# Patient Record
Sex: Male | Born: 1935 | Race: White | Hispanic: No | Marital: Married | Smoking: Never smoker
Health system: Southern US, Community
[De-identification: ages and names within clinical notes are randomized; demographics above are authoritative.]

## PROBLEM LIST (undated history)

## (undated) DIAGNOSIS — J45909 Unspecified asthma, uncomplicated: Secondary | ICD-10-CM

## (undated) DIAGNOSIS — I10 Essential (primary) hypertension: Secondary | ICD-10-CM

## (undated) DIAGNOSIS — E7800 Pure hypercholesterolemia, unspecified: Secondary | ICD-10-CM

## (undated) HISTORY — DX: Pure hypercholesterolemia, unspecified: E78.00

## (undated) HISTORY — PX: REPLACEMENT TOTAL KNEE: SUR1224

## (undated) HISTORY — PX: APPENDECTOMY: SHX54

## (undated) HISTORY — DX: Essential (primary) hypertension: I10

## (undated) HISTORY — DX: Unspecified asthma, uncomplicated: J45.909

## (undated) HISTORY — PX: ANKLE FRACTURE SURGERY: SHX122

---

## 2008-01-26 ENCOUNTER — Inpatient Hospital Stay (HOSPITAL_COMMUNITY): Admission: AD | Admit: 2008-01-26 | Discharge: 2008-01-27 | Payer: Self-pay | Admitting: Neurosurgery

## 2010-12-19 NOTE — Op Note (Signed)
[name], Robert Cain               ACCOUNT NO.:  [id number]   MEDICAL RECORD NO.:  [id number]          PATIENT TYPE:  INP   LOCATION:  3033                         FACILITY:  [facility]   PHYSICIAN:  Kathaleen Maser. Pool, M.D.    DATE OF BIRTH:  1929/10/21   DATE OF PROCEDURE:  01/26/2008  DATE OF DISCHARGE:                               OPERATIVE REPORT   PREOPERATIVE DIAGNOSIS:  L4-5 stenosis, bilaterally.   POSTOPERATIVE DIAGNOSIS:  L4-5 stenosis, bilaterally.   PROCEDURE NOTE:  Bilateral L4-5 decompressive laminotomy and  foraminotomies of both the L4 and L5 nerve roots.   SURGEON:  Kathaleen Maser. Pool, MD   ANESTHESIA:  General oroendotracheal.   INDICATION:  [name] is a 77 year old male with history of severe  lower extremity pain, right greater than left, consistent with  neurogenic claudication and radiculopathy.  Workup demonstrates no  evidence of marked lateral recess stenosis at L4-5 bilaterally.  The  patient presents now for bilateral L4-5 decompressive laminotomies in  hopes of improving his symptoms.   OPERATION:  The patient was placed on operating table in supine  position.  After an adequate level of anesthesia was achieved, the  patient was placed prone on the Wilson frame.  Appropriately padded the  patient's lumbar region, prepped and draped sterilely.  A 10 blade was  used to make a curvilinear skin incision overlying the L4-5 interspace.  This was carried down sharply in the midline.  A subperiosteal  dissection was then performed exposing the lamina and facet joints of L4  and L5 bilaterally.  Deep self-retaining retractor was placed.  Intraoperative fluoroscopy was used and levels were confirmed.  Laminotomy was then performed using high-speed drill and Kerrison  rongeurs to remove the inferior aspect of the lamina of L4, medial  aspect of the L4-5 facet joint, and the superior rim of the L5 lamina  bilaterally.  Ligamentum flavum was then elevated and resected in  a  piecemeal fashion using Kerrison rongeurs in the thecal sac and exiting  L5 nerve roots were identified bilaterally. remove the inferior aspect  lamina of four medial aspect of the L4-5 facet joint and superior rim of  the L5 lamina bilaterally.  Ligament flavum was elevated resected  fashion using Kerrison rongeurs.  Thecal sac and exiting L5 nerve roots  identified bilaterally.  Wide decompressive foraminotomies were then  performed along the exiting L4-L5 nerve roots bilaterally.  All elements  of the compression was relieved.  There is no injury to thecal sac or  nerve roots.  Wound was then irrigated with antibiotic solution.  Gelfoam was placed topically for hemostasis found to be good.  Microscope and retractor system removed.  Hemostasis in the muscles  achieved with electrocautery.  Wounds were then closed in layers with  Vicryl suture.  Steri-Strips, sterile dressing were applied.  There were  no complications.  The patient tolerated the procedure well and he  returned to the recovery room postoperatively.          ______________________________  Kathaleen Maser Pool, M.D.    HAP/MEDQ  D:  01/26/2008  T:  01/27/2008  Job:  [id number]

## 2011-05-03 LAB — CBC
HCT: 42.9
Hemoglobin: 14.6
MCHC: 34
Platelets: 201
RDW: 14.9

## 2011-05-03 LAB — ABO/RH: ABO/RH(D): O POS

## 2011-05-03 LAB — DIFFERENTIAL
Basophils Absolute: 0
Basophils Relative: 1
Eosinophils Relative: 4
Lymphocytes Relative: 21
Monocytes Absolute: 0.5
Neutro Abs: 3.8

## 2011-05-03 LAB — TYPE AND SCREEN: ABO/RH(D): O POS

## 2011-10-19 DIAGNOSIS — Z961 Presence of intraocular lens: Secondary | ICD-10-CM | POA: Insufficient documentation

## 2011-10-19 DIAGNOSIS — H353190 Nonexudative age-related macular degeneration, unspecified eye, stage unspecified: Secondary | ICD-10-CM | POA: Insufficient documentation

## 2012-06-16 DIAGNOSIS — Z947 Corneal transplant status: Secondary | ICD-10-CM | POA: Insufficient documentation

## 2013-01-10 DIAGNOSIS — M25519 Pain in unspecified shoulder: Secondary | ICD-10-CM | POA: Insufficient documentation

## 2013-12-20 DIAGNOSIS — M67362 Transient synovitis, left knee: Secondary | ICD-10-CM | POA: Insufficient documentation

## 2013-12-20 DIAGNOSIS — Z96659 Presence of unspecified artificial knee joint: Secondary | ICD-10-CM | POA: Insufficient documentation

## 2013-12-23 DIAGNOSIS — T8484XA Pain due to internal orthopedic prosthetic devices, implants and grafts, initial encounter: Secondary | ICD-10-CM | POA: Insufficient documentation

## 2013-12-23 DIAGNOSIS — Z96659 Presence of unspecified artificial knee joint: Secondary | ICD-10-CM

## 2014-01-31 DIAGNOSIS — M19019 Primary osteoarthritis, unspecified shoulder: Secondary | ICD-10-CM | POA: Insufficient documentation

## 2014-03-29 DIAGNOSIS — E785 Hyperlipidemia, unspecified: Secondary | ICD-10-CM

## 2014-03-29 DIAGNOSIS — E1169 Type 2 diabetes mellitus with other specified complication: Secondary | ICD-10-CM | POA: Insufficient documentation

## 2014-03-29 DIAGNOSIS — M810 Age-related osteoporosis without current pathological fracture: Secondary | ICD-10-CM | POA: Insufficient documentation

## 2014-03-29 DIAGNOSIS — I2510 Atherosclerotic heart disease of native coronary artery without angina pectoris: Secondary | ICD-10-CM | POA: Insufficient documentation

## 2014-03-29 DIAGNOSIS — I10 Essential (primary) hypertension: Secondary | ICD-10-CM | POA: Insufficient documentation

## 2014-03-29 DIAGNOSIS — M12522 Traumatic arthropathy, left elbow: Secondary | ICD-10-CM | POA: Insufficient documentation

## 2014-03-29 DIAGNOSIS — K219 Gastro-esophageal reflux disease without esophagitis: Secondary | ICD-10-CM | POA: Insufficient documentation

## 2014-03-29 DIAGNOSIS — R269 Unspecified abnormalities of gait and mobility: Secondary | ICD-10-CM | POA: Insufficient documentation

## 2014-03-29 DIAGNOSIS — B351 Tinea unguium: Secondary | ICD-10-CM | POA: Insufficient documentation

## 2014-03-29 DIAGNOSIS — R339 Retention of urine, unspecified: Secondary | ICD-10-CM | POA: Insufficient documentation

## 2014-03-29 DIAGNOSIS — G459 Transient cerebral ischemic attack, unspecified: Secondary | ICD-10-CM | POA: Insufficient documentation

## 2014-03-29 DIAGNOSIS — M2040 Other hammer toe(s) (acquired), unspecified foot: Secondary | ICD-10-CM | POA: Insufficient documentation

## 2014-03-29 DIAGNOSIS — G4762 Sleep related leg cramps: Secondary | ICD-10-CM | POA: Insufficient documentation

## 2014-03-29 DIAGNOSIS — D126 Benign neoplasm of colon, unspecified: Secondary | ICD-10-CM | POA: Insufficient documentation

## 2014-03-29 DIAGNOSIS — E663 Overweight: Secondary | ICD-10-CM | POA: Insufficient documentation

## 2014-03-29 DIAGNOSIS — M719 Bursopathy, unspecified: Secondary | ICD-10-CM

## 2014-03-29 DIAGNOSIS — R0789 Other chest pain: Secondary | ICD-10-CM | POA: Insufficient documentation

## 2014-03-29 DIAGNOSIS — R351 Nocturia: Secondary | ICD-10-CM | POA: Insufficient documentation

## 2014-03-29 DIAGNOSIS — R3129 Other microscopic hematuria: Secondary | ICD-10-CM | POA: Insufficient documentation

## 2014-03-29 DIAGNOSIS — M1711 Unilateral primary osteoarthritis, right knee: Secondary | ICD-10-CM | POA: Insufficient documentation

## 2014-03-29 DIAGNOSIS — R142 Eructation: Secondary | ICD-10-CM | POA: Insufficient documentation

## 2014-03-29 DIAGNOSIS — H905 Unspecified sensorineural hearing loss: Secondary | ICD-10-CM | POA: Insufficient documentation

## 2014-03-29 DIAGNOSIS — M159 Polyosteoarthritis, unspecified: Secondary | ICD-10-CM | POA: Insufficient documentation

## 2014-03-29 DIAGNOSIS — M21371 Foot drop, right foot: Secondary | ICD-10-CM | POA: Insufficient documentation

## 2014-03-29 DIAGNOSIS — R972 Elevated prostate specific antigen [PSA]: Secondary | ICD-10-CM | POA: Insufficient documentation

## 2014-03-29 DIAGNOSIS — J309 Allergic rhinitis, unspecified: Secondary | ICD-10-CM | POA: Insufficient documentation

## 2014-03-29 DIAGNOSIS — M67919 Unspecified disorder of synovium and tendon, unspecified shoulder: Secondary | ICD-10-CM | POA: Insufficient documentation

## 2014-03-29 DIAGNOSIS — N400 Enlarged prostate without lower urinary tract symptoms: Secondary | ICD-10-CM | POA: Insufficient documentation

## 2014-03-29 DIAGNOSIS — M722 Plantar fascial fibromatosis: Secondary | ICD-10-CM | POA: Insufficient documentation

## 2014-03-29 DIAGNOSIS — M222X9 Patellofemoral disorders, unspecified knee: Secondary | ICD-10-CM | POA: Insufficient documentation

## 2015-04-17 DIAGNOSIS — Z9889 Other specified postprocedural states: Secondary | ICD-10-CM | POA: Insufficient documentation

## 2015-05-11 DIAGNOSIS — Z79899 Other long term (current) drug therapy: Secondary | ICD-10-CM | POA: Insufficient documentation

## 2015-05-13 DIAGNOSIS — R0602 Shortness of breath: Secondary | ICD-10-CM | POA: Insufficient documentation

## 2015-05-13 DIAGNOSIS — R609 Edema, unspecified: Secondary | ICD-10-CM | POA: Insufficient documentation

## 2015-09-02 DIAGNOSIS — E785 Hyperlipidemia, unspecified: Secondary | ICD-10-CM | POA: Insufficient documentation

## 2016-03-01 DIAGNOSIS — R42 Dizziness and giddiness: Secondary | ICD-10-CM | POA: Insufficient documentation

## 2016-03-23 DIAGNOSIS — E11649 Type 2 diabetes mellitus with hypoglycemia without coma: Secondary | ICD-10-CM | POA: Insufficient documentation

## 2016-03-27 DIAGNOSIS — M7741 Metatarsalgia, right foot: Secondary | ICD-10-CM | POA: Insufficient documentation

## 2016-03-27 DIAGNOSIS — M7742 Metatarsalgia, left foot: Secondary | ICD-10-CM

## 2016-03-27 DIAGNOSIS — M79674 Pain in right toe(s): Secondary | ICD-10-CM | POA: Insufficient documentation

## 2016-07-11 DIAGNOSIS — E119 Type 2 diabetes mellitus without complications: Secondary | ICD-10-CM

## 2016-07-11 DIAGNOSIS — Z794 Long term (current) use of insulin: Secondary | ICD-10-CM

## 2016-07-11 DIAGNOSIS — IMO0001 Reserved for inherently not codable concepts without codable children: Secondary | ICD-10-CM | POA: Insufficient documentation

## 2016-07-15 DIAGNOSIS — E559 Vitamin D deficiency, unspecified: Secondary | ICD-10-CM | POA: Insufficient documentation

## 2017-05-02 DIAGNOSIS — G4733 Obstructive sleep apnea (adult) (pediatric): Secondary | ICD-10-CM | POA: Insufficient documentation

## 2017-08-26 ENCOUNTER — Ambulatory Visit (INDEPENDENT_AMBULATORY_CARE_PROVIDER_SITE_OTHER): Payer: Medicare Other | Admitting: Pediatrics

## 2017-08-26 ENCOUNTER — Encounter: Payer: Self-pay | Admitting: Pediatrics

## 2017-08-26 VITALS — BP 124/62 | HR 70 | Temp 98.2°F | Resp 22 | Ht 76.5 in | Wt 242.0 lb

## 2017-08-26 DIAGNOSIS — J0100 Acute maxillary sinusitis, unspecified: Secondary | ICD-10-CM | POA: Diagnosis not present

## 2017-08-26 DIAGNOSIS — I10 Essential (primary) hypertension: Secondary | ICD-10-CM

## 2017-08-26 DIAGNOSIS — Z79899 Other long term (current) drug therapy: Secondary | ICD-10-CM | POA: Diagnosis not present

## 2017-08-26 DIAGNOSIS — E119 Type 2 diabetes mellitus without complications: Secondary | ICD-10-CM | POA: Diagnosis not present

## 2017-08-26 DIAGNOSIS — J984 Other disorders of lung: Secondary | ICD-10-CM

## 2017-08-26 DIAGNOSIS — Z794 Long term (current) use of insulin: Secondary | ICD-10-CM

## 2017-08-26 DIAGNOSIS — J4531 Mild persistent asthma with (acute) exacerbation: Secondary | ICD-10-CM

## 2017-08-26 MED ORDER — ALBUTEROL SULFATE HFA 108 (90 BASE) MCG/ACT IN AERS: 2.0000 | INHALATION_SPRAY | RESPIRATORY_TRACT | 2 refills | Status: DC | PRN

## 2017-08-26 MED ORDER — AMOXICILLIN-POT CLAVULANATE 875-125 MG PO TABS
ORAL_TABLET | ORAL | 0 refills | Status: DC
Start: 2017-08-26 — End: 2018-03-31

## 2017-08-26 MED ORDER — FLUTICASONE PROPIONATE 50 MCG/ACT NA SUSP: NASAL | 5 refills | Status: AC

## 2017-08-26 NOTE — Progress Notes (Signed)
Robert Cain [id number] Dept: [phone]  New Patient Note  Patient ID: Robert Cain, male    DOB: 14-Aug-1929  Age: 84 y.o. MRN: [id number] Date of Office Visit: 08/26/2017 Referring provider: No referring provider defined for this encounter.    Chief Complaint: Nasal Congestion (runny nose); Cough (sx x 1 week.); and Wheezing (SOB plus weakness.)  HPI Robert Cain presents for evaluation of nasal congestion and coughing for 1 week. Initially he had a fever.. He has had some shortness of breath. He has a runny nose and a stuffy nose. He was started on doxycycline about 4 days ago and his symptoms have not improved. He has used an albuterol inhaler in the past. We had not seen him since 2015. He has a history of restrictive lung disease.Robert Cain He has not had eczema or chronic hives.  Review of Systems  Constitutional:       Fever a few days ago  HENT:       Sneezing, nasal congestion, postnasal drainage for about a week. Obstructive sleep apnea  Eyes:       Corneal transplant. Lens implants  Respiratory:       History of restrictive lung disease. He has needed albuterol in the past  Cardiovascular:       Hypertension, fluid retention and a history of 2 coronary stents   Gastrointestinal:       History of gastroesophageal reflux  Genitourinary:       Enlarged prostate  Musculoskeletal:       Total knee replacement  Skin: Negative.   Neurological:       TIA  Endo/Heme/Allergies:       Insulin-dependent type 2 diabetes for 25 years  Psychiatric/Behavioral: Negative.     Outpatient Encounter Medications as of 08/26/2017  Medication Sig  . Ascorbic Acid (C-500) 500 MG CHEW Frequency:   Dosage:0.0     Instructions:  Note:  . aspirin EC 81 MG tablet Take by mouth daily.   . ASSURE COMFORT LANCETS 28G MISC Check blood sugar 3 times daily.  . calcium carbonate (OS-CAL) 600 MG TABS tablet Take by mouth.  . cetirizine (ZYRTEC) 10 MG tablet Take by mouth 2 (two) times  daily.   . clopidogrel (PLAVIX) 75 MG tablet Take 75 mg by mouth daily.  . Cyanocobalamin 5000 MCG CAPS Take by mouth.  . DOCOSAHEXAENOIC ACID PO Take by mouth.  . doxycycline (VIBRA-TABS) 100 MG tablet Take 100 mg by mouth 2 (two) times daily.   . ferrous sulfate 325 (65 FE) MG tablet Take 325 mg by mouth.  . finasteride (PROSCAR) 5 MG tablet   . FREESTYLE LITE test strip 3 (three) times daily. for testing  . furosemide (LASIX) 40 MG tablet TK 1 T PO ONCE D  . insulin aspart (NOVOLOG) 100 UNIT/ML injection Inject into the skin.  . Insulin Pen Needle (EXEL COMFORT POINT PEN NEEDLE) 29G X 12MM MISC Use with insulin pens 3 times per day  . LANTUS SOLOSTAR 100 UNIT/ML Solostar Pen INJECT 20 UNITS SUB-Q QPM  . losartan (COZAAR) 25 MG tablet Take 1 tablet by mouth once daily  . magnesium oxide (MAG-OX) 400 MG tablet Take 400 mg by mouth.  . metFORMIN (GLUCOPHAGE-XR) 500 MG 24 hr tablet TAKE 1 TABLET BY MOUTH TWICE DAILY 30 MINUTES BEFORE A MEAL  . metoprolol succinate (TOPROL-XL) 50 MG 24 hr tablet TK 1 T PO D  . mirabegron ER (MYRBETRIQ) 25 MG TB24 tablet Take 25 mg by  mouth.  . Multiple Vitamins-Minerals (PRESERVISION/LUTEIN PO) Take by mouth.  . nitroGLYCERIN (NITROSTAT) 0.4 MG SL tablet Place under the tongue.  Robert Cain NOVOLOG MIX 70/30 FLEXPEN (70-30) 100 UNIT/ML FlexPen   . potassium chloride SA (K-DUR,KLOR-CON) 20 MEQ tablet Take by mouth.  . pravastatin (PRAVACHOL) 40 MG tablet TK 1 T PO QPM  . [DISCONTINUED] calcium-vitamin D (CAL-CITRATE PLUS VITAMIN D) 250-100 MG-UNIT tablet Frequency:   Dosage:0.0     Instructions:  Note:  . [DISCONTINUED] calcium-vitamin D (CAL-CITRATE PLUS VITAMIN D) 250-100 MG-UNIT tablet Frequency:   Dosage:0.0     Instructions:  Note:  . [DISCONTINUED] clopidogrel (PLAVIX) 75 MG tablet take 1 tablet by mouth once daily  . [DISCONTINUED] glucose blood (FREESTYLE TEST STRIPS) test strip CHECK BLOOD SUGARS 5 TIMES DAILY.   ICD 10 CODE E11.9  . [DISCONTINUED] insulin  aspart (NOVOLOG FLEXPEN) 100 UNIT/ML FlexPen Inject 15 units twice per day. E11.4  . [DISCONTINUED] Insulin Glargine (LANTUS SOLOSTAR) 100 UNIT/ML Solostar Pen Inject 20 units at night.  E11.4  . [DISCONTINUED] Insulin Glargine 300 UNIT/ML SOPN Inject 15 units at bedtime each night.  Robert Cain albuterol (PROAIR HFA) 108 (90 Base) MCG/ACT inhaler Inhale 2 puffs into the lungs every 4 (four) hours as needed for wheezing or shortness of breath.  Robert Cain amoxicillin-clavulanate (AUGMENTIN) 875-125 MG tablet One tablet every 12 hours x 10 days for infection.  . benzonatate (TESSALON) 100 MG capsule Take by mouth.  . fluticasone (FLONASE) 50 MCG/ACT nasal spray Two sprays each nostril at night after doing nasal saline irrigations.  . [DISCONTINUED] insulin glargine (LANTUS) 100 UNIT/ML injection Inject into the skin.  . [DISCONTINUED] losartan (COZAAR) 25 MG tablet TK 1 T PO  ONCE DAILY  . [DISCONTINUED] NOVOLOG FLEXPEN 100 UNIT/ML FlexPen INJECT 15 UNITS BID  . [DISCONTINUED] vitamin B-12 (CYANOCOBALAMIN) 1000 MCG tablet Take by mouth.   No facility-administered encounter medications on file as of 08/26/2017.      Drug Allergies:  Allergies  Allergen Reactions  . Penicillins Hives and Rash    Other reaction(s): RASH   . Codeine Rash    Other reaction(s): RASH   . Morphine Rash    Other reaction(s): RASH     Family History: Braison's family history is not on file.. Family history is negative for asthma, hayfever, eczema, hives, food allergies, chronic bronchitis or emphysema. Family history is positive for sinus problems.  Social and environmental. There is a dog in the home. He is not exposed to cigarette smoking. He has never smoked cigarettes in the past. He works indoors. His symptoms are not worse at work  Physical Exam: BP 124/62 (BP Location: Right Arm, Patient Position: Sitting, Cuff Size: Large)   Pulse 70   Temp 98.2 F (36.8 C) (Oral)   Resp (!) 22   Ht 6' 4.5" (1.943 m)   Wt 242 lb  (109.8 kg)   SpO2 94%   BMI 29.07 kg/m    Physical Exam  Constitutional: He is oriented to person, place, and time. He appears well-developed and well-nourished.  HENT:  Eyes normal. Ears normal. Nose mild swelling of nasal turbinates. Pharynx normal except for yellow-green postnasal drainage  Neck: Neck supple.  Cardiovascular:  S1 and S2 normal no murmurs  Pulmonary/Chest:  Clear to percussion auscultation except for decreased breath sounds at the bases  Abdominal: Soft. There is no tenderness (no hepatosplenomegaly).  Lymphadenopathy:    He has no cervical adenopathy.  Neurological: He is oriented to person, place, and time.  Skin:  Clear  Psychiatric: He has a normal mood and affect. His behavior is normal. Judgment and thought content normal.  Vitals reviewed.   Diagnostics: FVC 2.38 L FEV1 1.95 L. Predicted FVC 4.86 L predicted FEV1 3.45 L. After albuterol by nebulization FVC 2.46 L FEV1 1.92 L-the spirometry shows moderate -severe reduction in the forced vital capacity with no significant improvement in the FEV1 after albuterol. His values are stable for him when compared to his last visit in 2015   Assessment  Assessment and Plan: 1. Mild persistent reactive airway disease with acute exacerbation   2. Acute non-recurrent maxillary sinusitis   3. Essential hypertension   4. Current use of beta blocker   5. Insulin dependent type 2 diabetes mellitus ([place])   6. Restrictive lung disease     Meds ordered this encounter  Medications  . fluticasone (FLONASE) 50 MCG/ACT nasal spray    Sig: Two sprays each nostril at night after doing nasal saline irrigations.    Dispense:  16 g    Refill:  5  . amoxicillin-clavulanate (AUGMENTIN) 875-125 MG tablet    Sig: One tablet every 12 hours x 10 days for infection.    Dispense:  20 tablet    Refill:  0  . albuterol (PROAIR HFA) 108 (90 Base) MCG/ACT inhaler    Sig: Inhale 2 puffs into the lungs every 4 (four) hours as needed  for wheezing or shortness of breath.    Dispense:  1 Inhaler    Refill:  2    Patient Instructions  Fluticasone 2 sprays per nostril at night after doing nasal saline irrigations Stop doxycycline Augmentin 875 mg-take 1 tablet every 12 hours for 10 days for infection Pro-air 2 puffs every 4 hours if needed for wheezing or coughing spells Hycodan 1 tablet every 6 hours if needed for cough Call me if you're not doing better on this treatment plan   Return in about 1 year (around 08/26/2018), or if symptoms worsen or fail to improve.   Thank you for the opportunity to care for this patient.  Please do not hesitate to contact me with questions.  Penne Lash, M.D.  [facility] [address] [place], [place] [address] [phone]

## 2017-08-26 NOTE — Patient Instructions (Signed)
Fluticasone 2 sprays per nostril at night after doing nasal saline irrigations Stop doxycycline Augmentin 875 mg-take 1 tablet every 12 hours for 10 days for infection Pro-air 2 puffs every 4 hours if needed for wheezing or coughing spells Hycodan 1 tablet every 6 hours if needed for cough Call me if you're not doing better on this treatment plan

## 2017-08-28 DIAGNOSIS — J0100 Acute maxillary sinusitis, unspecified: Secondary | ICD-10-CM | POA: Insufficient documentation

## 2017-08-28 DIAGNOSIS — J984 Other disorders of lung: Secondary | ICD-10-CM | POA: Insufficient documentation

## 2017-08-28 DIAGNOSIS — E119 Type 2 diabetes mellitus without complications: Secondary | ICD-10-CM | POA: Insufficient documentation

## 2017-08-28 DIAGNOSIS — J45909 Unspecified asthma, uncomplicated: Secondary | ICD-10-CM | POA: Insufficient documentation

## 2017-08-28 DIAGNOSIS — Z794 Long term (current) use of insulin: Secondary | ICD-10-CM

## 2017-09-02 ENCOUNTER — Ambulatory Visit: Payer: Self-pay | Admitting: Pediatrics

## 2017-09-04 ENCOUNTER — Telehealth: Payer: Self-pay

## 2017-09-04 NOTE — Telephone Encounter (Signed)
Patient still coughing bad.  Used all Hycodan Syrup.  Patient would like a refill.  Will come by to pick up Rx (controlled drug).  Patient has had several episodes over the last 2 months where he is gasping for air. Patient had a recent episode on 09/01/17 that lasted 10 minutes where patient could not catch breath and was gasping for air. Patient sees Dr. Shaune Leeks but he is not in clinic again until Monday, 09/09/17. Patient made OV to see Gareth Morgan at 8:30 am on Monday, 09/09/17. Patient wife wanted to know if she should call primary doctor or cardiologist about the gasping for air.  Spoke with Dr. Verlin Fester.  Per Dr. Verlin Fester, this is too much codeine for patients age. Will not give any refills. He should follow up with his cardiologist and primary care MD.  Keep OV with [place] for Monday (with Webb Silversmith).

## 2017-09-04 NOTE — Telephone Encounter (Signed)
Called patient wife.  Gave all information.  She will call [place]'s cardiologist today regarding the gasping for air issue.  She will keep OV with Webb Silversmith for Monday.

## 2017-09-09 ENCOUNTER — Ambulatory Visit (INDEPENDENT_AMBULATORY_CARE_PROVIDER_SITE_OTHER): Payer: Medicare Other | Admitting: Pediatrics

## 2017-09-09 ENCOUNTER — Encounter: Payer: Self-pay | Admitting: Family Medicine

## 2017-09-09 VITALS — BP 118/62 | HR 65 | Temp 97.7°F | Resp 20

## 2017-09-09 DIAGNOSIS — Z794 Long term (current) use of insulin: Secondary | ICD-10-CM

## 2017-09-09 DIAGNOSIS — E119 Type 2 diabetes mellitus without complications: Secondary | ICD-10-CM

## 2017-09-09 DIAGNOSIS — J4530 Mild persistent asthma, uncomplicated: Secondary | ICD-10-CM | POA: Diagnosis not present

## 2017-09-09 DIAGNOSIS — J984 Other disorders of lung: Secondary | ICD-10-CM | POA: Diagnosis not present

## 2017-09-09 DIAGNOSIS — I10 Essential (primary) hypertension: Secondary | ICD-10-CM | POA: Diagnosis not present

## 2017-09-09 DIAGNOSIS — IMO0001 Reserved for inherently not codable concepts without codable children: Secondary | ICD-10-CM

## 2017-09-09 MED ORDER — BENZONATATE 100 MG PO CAPS
200.0000 mg | ORAL_CAPSULE | Freq: Four times a day (QID) | ORAL | 5 refills | Status: DC | PRN
Start: 2017-09-09 — End: 2018-03-31

## 2017-09-09 MED ORDER — MONTELUKAST SODIUM 10 MG PO TABS: 10.0000 mg | ORAL_TABLET | Freq: Every day | ORAL | 3 refills | Status: DC

## 2017-09-09 NOTE — Patient Instructions (Addendum)
Fluticasone 2 sprays per nostril at night after doing nasal saline irrigations ProAir 2 puffs every 4 hours if needed for wheezing or coughing spells Montelukast 10 mg 1 tablet once a day for cough or wheeze  Benzonatate 100 mg - 2 tablets every 6 hours if needed for cough Call me if you are not doing well on this treatment plan  Continue Mucinex

## 2017-09-09 NOTE — Progress Notes (Signed)
  [place] [address] Dept: [phone]  FOLLOW UP NOTE  Patient ID: Robert Cain, male    DOB: [birth year]  Age: 84 y.o. MRN: [id number] Date of Office Visit: 09/09/2017  Assessment  Chief Complaint: Weakness and Cough  HPI Robert Cain presents for follow-up of a cough. He finished a ten-day course of Augmentin 875 mg every 12 hours and he  feels better. He did use prednisone 10 mg twice a day for 4 days, 10 mg on the fifth day. He is using Mucinex, nasal saline irrigations at night followed by fluticasone 2 sprays per nostril at night. His cough has improved and there is no discoloration. Pro-air 2 puffs did help the cough. His diabetes is well controlled  Current medications are outlined in the chart   Drug Allergies:  Allergies  Allergen Reactions  . Penicillins Hives and Rash    Other reaction(s): RASH   . Codeine Rash    Other reaction(s): RASH   . Morphine Rash    Other reaction(s): RASH     Physical Exam: BP 118/62   Pulse 65   Temp 97.7 F (36.5 C) (Oral)   Resp 20   SpO2 94%    Physical Exam  Constitutional: He is oriented to person, place, and time. He appears well-developed and well-nourished.  HENT:  Eyes normal. Ears normal. Nose mild swelling of nasal turbinates. Pharynx normal.  Neck: Neck supple.  Cardiovascular:  S1 and S2 normal no murmurs  Pulmonary/Chest:  Clear to percussion and auscultation  Lymphadenopathy:    He has no cervical adenopathy.  Neurological: He is alert and oriented to person, place, and time.  Psychiatric: He has a normal mood and affect. His behavior is normal. Judgment and thought content normal.  Vitals reviewed.   Diagnostics:  FVC 2.55 L FEV1 2.12 L. Predicted FVC 4.86 L predicted FEV1 3.45 L-this shows a moderate reduction in the forced vital capacity but stable for him  Assessment and Plan: 1. Mild persistent asthma without complication   2. Restrictive lung disease   3. Essential  hypertension   4. Insulin dependent diabetes mellitus ([place])     Meds ordered this encounter  Medications  . montelukast (SINGULAIR) 10 MG tablet    Sig: Take 1 tablet (10 mg total) by mouth at bedtime.    Dispense:  90 tablet    Refill:  3    90 day supply  . benzonatate (TESSALON PERLES) 100 MG capsule    Sig: Take 2 capsules (200 mg total) by mouth every 6 (six) hours as needed for cough.    Dispense:  120 capsule    Refill:  5    Patient Instructions  Fluticasone 2 sprays per nostril at night after doing nasal saline irrigations ProAir 2 puffs every 4 hours if needed for wheezing or coughing spells Montelukast 10 mg 1 tablet once a day for cough or wheeze  Benzonatate 100 mg - 2 tablets every 6 hours if needed for cough Call me if you are not doing well on this treatment plan  Continue Mucinex   Return in about 3 months (around 12/07/2017).    Thank you for the opportunity to care for this patient.  Please do not hesitate to contact me with questions.  Penne Lash, M.D.  [facility] [address] [place], [place] [address] [phone]

## 2018-03-21 ENCOUNTER — Telehealth: Payer: Self-pay | Admitting: *Deleted

## 2018-03-21 NOTE — Telephone Encounter (Signed)
Patient called to make an appointment for dizziness.  States he went to two different doctors yesterday who recommended seeing his allergist for possible inner ear issues. Patient is wondering if he should come and see doctor Bardelas or if he should just go to an ENT.

## 2018-03-24 NOTE — Telephone Encounter (Signed)
Please call patient.  Since this issue is dizziness, he should see his ENT specialist

## 2018-03-24 NOTE — Telephone Encounter (Signed)
Called patient.  He does not have an ENT.  Per Dr. Shaune Leeks,  He would recommend [facility] [address] [place], [place] [address]  Phone: [phone] Dr. Hassell Done. Gave patient all information.  He will contact their office for an evaluation of dizziness.  Dr. Shaune Leeks informed.

## 2018-03-31 ENCOUNTER — Encounter: Payer: Self-pay | Admitting: Pediatrics

## 2018-03-31 ENCOUNTER — Ambulatory Visit (INDEPENDENT_AMBULATORY_CARE_PROVIDER_SITE_OTHER): Payer: Medicare Other | Admitting: Pediatrics

## 2018-03-31 VITALS — BP 144/78 | HR 78 | Temp 97.5°F | Resp 16

## 2018-03-31 DIAGNOSIS — J984 Other disorders of lung: Secondary | ICD-10-CM

## 2018-03-31 DIAGNOSIS — I10 Essential (primary) hypertension: Secondary | ICD-10-CM | POA: Diagnosis not present

## 2018-03-31 DIAGNOSIS — J4531 Mild persistent asthma with (acute) exacerbation: Secondary | ICD-10-CM | POA: Insufficient documentation

## 2018-03-31 DIAGNOSIS — Z794 Long term (current) use of insulin: Secondary | ICD-10-CM

## 2018-03-31 DIAGNOSIS — E119 Type 2 diabetes mellitus without complications: Secondary | ICD-10-CM

## 2018-03-31 DIAGNOSIS — IMO0001 Reserved for inherently not codable concepts without codable children: Secondary | ICD-10-CM

## 2018-03-31 DIAGNOSIS — J4530 Mild persistent asthma, uncomplicated: Secondary | ICD-10-CM

## 2018-03-31 MED ORDER — MONTELUKAST SODIUM 10 MG PO TABS: ORAL_TABLET | ORAL | 5 refills | Status: DC

## 2018-03-31 NOTE — Progress Notes (Signed)
[address] [place]  [address] Dept: [phone]  FOLLOW UP NOTE  Patient ID: Robert Cain, male    DOB: [birth year]  Age: 84 y.o. MRN: [id number] Date of Office Visit: 03/31/2018  Assessment  Chief Complaint: Cough (cough is some better.)  HPI Robert Cain is an 84 year old male who presents to the clinic for a follow up visit. He is accomapnied by his wife who assists with history. He was last seen in this office on 09/09/2017 by Dr. Shaune Leeks for evaluation of cough, asthma, and rhinitis. Prior to that visit, he took a course of Augmentin 875 mg twice a day for 10 days and prednisone for 4 days with significant relief of symptoms. At that visit, he continued montelukast, albuterol inhaler, Mucinex, Flonase, and nasal rinses.   At today's visit, he reports the cough has significantly improved since the last visit. He is currently not taking montelukast, Mucinex, or benzonatate. His asthma is reported as moderately well controlled with symptoms including shortness of breath and wheezing with activity. He stopped his montelukast several months ago when his cough resolved.   Allergic rhinitis is reported as moderately well controlled with runny nose as the main symptom. He reports taking an over the counter antihistamine 2 times a day to control the runny nose. He is not currently using Flonase or nasal saline rinses.   His current medications are listed in the chart.   Drug Allergies:  Allergies  Allergen Reactions  . Penicillins Hives and Rash    Other reaction(s): RASH   . Codeine Rash    Other reaction(s): RASH   . Morphine Rash    Other reaction(s): RASH     Physical Exam: BP (!) 144/78 (BP Location: Right Arm, Patient Position: Sitting, Cuff Size: Normal)   Pulse 78   Temp (!) 97.5 F (36.4 C) (Oral)   Resp 16   SpO2 96%    Physical Exam  Constitutional: He is oriented to person, place, and time. He appears well-developed and well-nourished.  HENT:  Head:  Normocephalic.  Right Ear: External ear normal.  Left Ear: External ear normal.  Mouth/Throat: Oropharynx is clear and moist.  Bilateral nares slightly erythematous and edematous with clear nasal drainage noted. Pharynx normal. Ears normal. Eyes normal.  Eyes: Conjunctivae are normal.  Neck: Normal range of motion. Neck supple.  Cardiovascular: Normal rate, regular rhythm and normal heart sounds.  No murmur noted  Pulmonary/Chest: Effort normal and breath sounds normal.  Lungs clear to auscultation  Musculoskeletal: Normal range of motion.  Neurological: He is alert and oriented to person, place, and time.  Skin: Skin is warm and dry.  Psychiatric: He has a normal mood and affect. His behavior is normal. Judgment and thought content normal.  Vitals reviewed.   Diagnostics: FVC 2.66, FEV1 2.09. Predicted FVC 4.86, predicted FEV1 3.45. Spirometry indicates mild restriction. This is consistent with previous readings.   Assessment and Plan: 1. Mild persistent asthma without complication   2. Restrictive lung disease   3. Essential hypertension   4. Insulin dependent diabetes mellitus ([place])     Meds ordered this encounter  Medications  . montelukast (SINGULAIR) 10 MG tablet    Sig: Take 1 tablet once at bedtime for coughing or wheezing.    Dispense:  34 tablet    Refill:  5    Patient Instructions  Fluticasone 2 sprays per nostril at night after doing nasal saline irrigations ProAir 2 puffs every 4 hours if needed for wheezing  or coughing spells. Use 2 puffs of ProAir 5-15 minutes before exercise to prevent cough or wheeze Montelukast 10 mg 1 tablet once a day to prevent cough or wheeze  Benzonatate 100 mg - 2 tablets every 6 hours if needed for cough Call me if you are not doing well on this treatment plan  Continue Mucinex 600 mg twice a day as needed to thin mucus secretions Decrease your over the counter antihistamine to once a day Follow up with your primary care doctor  for your blood pressure.  Continue with the other medications as listed in your chart.  Follow up in 3 months or sooner if needed   Return in about 3 months (around 07/01/2018), or if symptoms worsen or fail to improve.   Thank you for the opportunity to care for this patient.  Please do not hesitate to contact me with questions.  Gareth Morgan, FNP [facility]  I have provided oversight concerning Gareth Morgan' evaluation and treatment of this patient's health issues addressed during today's encounter. I agree with the assessment and therapeutic plan as outlined in the note.   Thank you for the opportunity to care for this patient.  Please do not hesitate to contact me with questions.  Penne Lash, M.D.  [facility] [address] [place], [place] [address] [phone]

## 2018-03-31 NOTE — Patient Instructions (Addendum)
Fluticasone 2 sprays per nostril at night after doing nasal saline irrigations ProAir 2 puffs every 4 hours if needed for wheezing or coughing spells. Use 2 puffs of ProAir 5-15 minutes before exercise to prevent cough or wheeze Montelukast 10 mg 1 tablet once a day to prevent cough or wheeze  Benzonatate 100 mg - 2 tablets every 6 hours if needed for cough Call me if you are not doing well on this treatment plan  Continue Mucinex 600 mg twice a day as needed to thin mucus secretions Decrease your over the counter antihistamine to once a day Follow up with your primary care doctor for your blood pressure.  Continue with the other medications as listed in your chart.  Follow up in 3 months or sooner if needed

## 2018-04-15 ENCOUNTER — Other Ambulatory Visit: Payer: Self-pay | Admitting: Allergy

## 2018-04-15 MED ORDER — ALBUTEROL SULFATE HFA 108 (90 BASE) MCG/ACT IN AERS: 2.0000 | INHALATION_SPRAY | RESPIRATORY_TRACT | 2 refills | Status: AC | PRN

## 2018-08-12 ENCOUNTER — Ambulatory Visit (INDEPENDENT_AMBULATORY_CARE_PROVIDER_SITE_OTHER): Payer: Medicare Other | Admitting: Pediatrics

## 2018-08-12 ENCOUNTER — Encounter: Payer: Self-pay | Admitting: Pediatrics

## 2018-08-12 VITALS — BP 120/70 | HR 64 | Resp 20 | Ht 76.5 in | Wt 249.0 lb

## 2018-08-12 DIAGNOSIS — Z794 Long term (current) use of insulin: Secondary | ICD-10-CM

## 2018-08-12 DIAGNOSIS — I10 Essential (primary) hypertension: Secondary | ICD-10-CM

## 2018-08-12 DIAGNOSIS — R42 Dizziness and giddiness: Secondary | ICD-10-CM

## 2018-08-12 DIAGNOSIS — IMO0001 Reserved for inherently not codable concepts without codable children: Secondary | ICD-10-CM

## 2018-08-12 DIAGNOSIS — E119 Type 2 diabetes mellitus without complications: Secondary | ICD-10-CM | POA: Diagnosis not present

## 2018-08-12 DIAGNOSIS — L500 Allergic urticaria: Secondary | ICD-10-CM | POA: Diagnosis not present

## 2018-08-12 DIAGNOSIS — J4520 Mild intermittent asthma, uncomplicated: Secondary | ICD-10-CM | POA: Diagnosis not present

## 2018-08-12 MED ORDER — CETIRIZINE HCL 10 MG PO TABS: 10.0000 mg | ORAL_TABLET | Freq: Two times a day (BID) | ORAL | 5 refills | Status: DC

## 2018-08-12 NOTE — Patient Instructions (Addendum)
Zyrtec 10 mg-take 1 tablet at night for itching.  You may take 1 tablet twice a day if needed Stop using Allegra Use Dreft or Ivory detergent.  Do not use bleaches or softeners Sarna lotion 3 times a day if needed to the itchy areas Continue on his other medications Avoid large amounts of tomato Do foods with salicylates make you itch? Proventil 2 puffs every 4 hours if needed for wheezing or coughing spells See your cardiologist about dizziness when you bend over.  A blood pressure of 120/70 at your age could be a contributing factor when you bend over.  The dizziness could be a postural hypotension Monitor your blood pressure daily

## 2018-08-12 NOTE — Progress Notes (Addendum)
[address] [place]  [address] Dept: [phone]  FOLLOW UP NOTE  Patient ID: Robert Cain, male    DOB: 03/31/1930  Age: 85 y.o. MRN: [id number] Date of Office Visit: 08/12/2018  Assessment  Chief Complaint: Allergy Testing  HPI Robert Cain presents for evaluation of itching since August.  In late September he received a steroid injection and his symptoms improved until October, but the steroid injection raise his blood sugars.  His itching is worse on his thorax, back and flanks.  He has been using Allegra 180 mg once a day.  As a child he was allergic to tomato and chocolate.  His asthma is well controlled and he very rarely has to use 2 puffs of Proventil.  He is no longer on montelukast.  His nasal symptoms are under control and he does not have to use fluticasone.  He has insulin-dependent diabetes with some kidney impairment Allegra is excreted by the kidneys He has noted dizziness when he bends over and stands back up making postural hypotension a possibility.  He takes losartan 3 days/week.   Drug Allergies:  Allergies  Allergen Reactions  . Penicillins Hives and Rash    Other reaction(s): RASH   . Codeine Rash    Other reaction(s): RASH   . Morphine Rash    Other reaction(s): RASH     Physical Exam: BP 120/70 (BP Location: Left Arm, Patient Position: Sitting, Cuff Size: Normal)   Pulse 64   Resp 20   Ht 6' 4.5" (1.943 m)   Wt 249 lb (112.9 kg)   SpO2 91%   BMI 29.91 kg/m    Physical Exam Constitutional:      Appearance: Normal appearance. He is normal weight.  HENT:     Head:     Comments: Eyes normal.  Ears normal.  Nose normal.  Pharynx normal. Neck:     Musculoskeletal: Neck supple.  Cardiovascular:     Comments: S1-S2 normal no murmurs Pulmonary:     Comments: Clear to percussion and auscultation Lymphadenopathy:     Cervical: No cervical adenopathy.  Skin:    Comments: Clear except for mild dermographia.  There were a few pinpoint  macules  Neurological:     General: No focal deficit present.     Mental Status: He is alert and oriented to person, place, and time.  Psychiatric:        Mood and Affect: Mood normal.        Behavior: Behavior normal.        Thought Content: Thought content normal.        Judgment: Judgment normal.     Diagnostics: Allergy skin testing to foods showed mild reactivity to tomato  FVC 2.26 L FEV1 1.72 L.  Predicted FVC 4.46 L predicted FEV1 3.14 L-this shows moderate restriction-consistent with previous readings  Assessment and Plan: 1. Allergic urticaria   2. Mild intermittent asthma without complication   3. Insulin dependent diabetes mellitus ([place])   4. Essential hypertension   5. Dizziness     Meds ordered this encounter  Medications  . cetirizine (ZYRTEC) 10 MG tablet    Sig: Take 1 tablet (10 mg total) by mouth 2 (two) times daily.    Dispense:  30 tablet    Refill:  5    Patient Instructions  Zyrtec 10 mg-take 1 tablet at night for itching.  You may take 1 tablet twice a day if needed Stop using Allegra Use Dreft or Ivory detergent.  Do not use bleaches or softeners Sarna lotion 3 times a day if needed to the itchy areas Continue on his other medications Avoid large amounts of tomato Do foods with salicylates make you itch? Proventil 2 puffs every 4 hours if needed for wheezing or coughing spells See your cardiologist about dizziness when you bend over.  A blood pressure of 120/70 at your age could be a contributing factor when you bend over.  The dizziness could be a postural hypotension Monitor your blood pressure daily      Return in about 4 weeks (around 09/09/2018).    Thank you for the opportunity to care for this patient.  Please do not hesitate to contact me with questions.  Penne Lash, M.D.  [facility] [address] [place], [place] [address] [phone]

## 2018-09-08 ENCOUNTER — Encounter: Payer: Self-pay | Admitting: Family Medicine

## 2018-09-08 ENCOUNTER — Ambulatory Visit (INDEPENDENT_AMBULATORY_CARE_PROVIDER_SITE_OTHER): Payer: Medicare Other | Admitting: Family Medicine

## 2018-09-08 ENCOUNTER — Ambulatory Visit: Payer: Medicare Other | Admitting: Pediatrics

## 2018-09-08 VITALS — BP 130/80 | HR 91 | Temp 97.7°F | Resp 16

## 2018-09-08 DIAGNOSIS — I10 Essential (primary) hypertension: Secondary | ICD-10-CM

## 2018-09-08 DIAGNOSIS — J4530 Mild persistent asthma, uncomplicated: Secondary | ICD-10-CM | POA: Diagnosis not present

## 2018-09-08 DIAGNOSIS — Z794 Long term (current) use of insulin: Secondary | ICD-10-CM

## 2018-09-08 DIAGNOSIS — J984 Other disorders of lung: Secondary | ICD-10-CM

## 2018-09-08 DIAGNOSIS — R42 Dizziness and giddiness: Secondary | ICD-10-CM | POA: Diagnosis not present

## 2018-09-08 DIAGNOSIS — E119 Type 2 diabetes mellitus without complications: Secondary | ICD-10-CM

## 2018-09-08 DIAGNOSIS — L500 Allergic urticaria: Secondary | ICD-10-CM | POA: Diagnosis not present

## 2018-09-08 NOTE — Patient Instructions (Addendum)
Continue Zyrtec 10 mg-take 1 tablet at night for itching.  You may take 1 tablet twice a day if needed  Continue to use Dreft or Ivory detergent.  Do not use bleaches or softeners Sarna lotion 3 times a day if needed to the itchy areas  Avoid large amounts of tomato. Continue to avoid chocolate Follow up with the blood testing recommended by your endocrinologist Proventil 2 puffs every 4 hours if needed for wheezing or coughing spells See your cardiologist about dizziness when you bend over.  Monitor your blood pressure daily Continue on his other medications  Call us if this treatment plan is not working well for toy  Follow up in 3 months or sooner if needed

## 2018-09-08 NOTE — Progress Notes (Signed)
[address] [place] [address] Dept: [phone]  FOLLOW UP NOTE  Patient ID: Robert Cain, male    DOB: November 28, 1929  Age: 85 y.o. MRN: [id number] Date of Office Visit: 09/08/2018  Assessment  Chief Complaint: Pruritus  HPI Robert Cain is an 85 year old male who presents to the clinic for a follow up visit. He reports that he continues to experience itch which is worse at night and is currently located on both flank areas and occasionally the abdominal area. He denies a raised rash or redness with the itch. He has cut out tomato and chocolate and did not notice much difference, however, he recently ate 4 pieces of chocolate which exacerbated the itch. He has tried Sarna lotion and cetirizine with no relief from itch. He stopped taking Plavix 3 nights ago at the suggestion of his cardiology team. Asthma is reported as well controlled with occasional shortness of breath with activity. He used his albuterol 2 times over the last month. He reports continuing dizziness when bending over or looking down. His current medications are listed in the chart.    Drug Allergies:  Allergies  Allergen Reactions  . Penicillins Hives and Rash    Other reaction(s): RASH   . Codeine Rash    Other reaction(s): RASH   . Morphine Rash    Other reaction(s): RASH     Physical Exam: BP 130/80 (BP Location: Left Arm, Patient Position: Sitting, Cuff Size: Normal)   Pulse 91   Temp 97.7 F (36.5 C) (Oral)   Resp 16   SpO2 96%    Physical Exam Vitals signs reviewed.  Constitutional:      Appearance: Normal appearance.  HENT:     Head: Normocephalic and atraumatic.     Right Ear: Tympanic membrane normal.     Left Ear: Tympanic membrane normal.     Nose: Nose normal.     Mouth/Throat:     Pharynx: Oropharynx is clear.  Eyes:     Conjunctiva/sclera: Conjunctivae normal.  Neck:     Musculoskeletal: Normal range of motion and neck supple.  Cardiovascular:     Rate and Rhythm: Normal  rate and regular rhythm.     Heart sounds: Normal heart sounds.  Pulmonary:     Effort: Pulmonary effort is normal.     Breath sounds: Normal breath sounds.     Comments: Lungs clear to auscultation Musculoskeletal: Normal range of motion.  Skin:    General: Skin is warm and dry.     Comments: No rash, raised area, or redness noted  Neurological:     Mental Status: He is alert and oriented to person, place, and time.  Psychiatric:        Mood and Affect: Mood normal.        Behavior: Behavior normal.        Thought Content: Thought content normal.        Judgment: Judgment normal.     Diagnostics: FVC 2.20, FEV1 1.88. Predicted FVC 5.03, predicted FEV1 3.58. Spirometry indicates severe restriction. This is consistent with previous spirometry readings.  Assessment and Plan: 1. Allergic urticaria   2. Mild persistent asthma without complication   3. Dizziness   4. Restrictive lung disease   5. Insulin dependent type 2 diabetes mellitus ([place])   6. Essential hypertension     Patient Instructions  Continue Zyrtec 10 mg-take 1 tablet at night for itching.  You may take 1 tablet twice a day if needed  Continue to use Dreft or Ivory detergent.  Do not use bleaches or softeners Sarna lotion 3 times a day if needed to the itchy areas  Avoid large amounts of tomato. Continue to avoid chocolate Follow up with the blood testing recommended by your endocrinologist Proventil 2 puffs every 4 hours if needed for wheezing or coughing spells See your cardiologist about dizziness when you bend over.  Monitor your blood pressure daily Continue on his other medications  Call us if this treatment plan is not working well for toy  Follow up in 3 months or sooner if needed  Return in about 3 months (around 12/07/2018), or if symptoms worsen or fail to improve.   Thank you for the opportunity to care for this patient.  Please do not hesitate to contact me with questions.  Gareth Morgan,  FNP [facility]  I have provided oversight concerning Gareth Morgan' evaluation and treatment of this patient's health issues addressed during today's encounter. I agree with the assessment and therapeutic plan as outlined in the note.   Thank you for the opportunity to care for this patient.  Please do not hesitate to contact me with questions.  Penne Lash, M.D.  [facility] [address] [place], [place] [address] [phone]

## 2018-09-19 ENCOUNTER — Telehealth: Payer: Self-pay

## 2018-09-19 NOTE — Telephone Encounter (Signed)
Spoke with pt.'s wife. Noted in his chart that pt. Wasn't taking his montelukast 10 mg. We received a fax from his drug store wanting a refill on his montelukast 10 mg. Pt.'s wife not at home so she will check with husband when she gets home. Wife said don't fill till Monday.

## 2018-09-22 ENCOUNTER — Other Ambulatory Visit: Payer: Self-pay | Admitting: Allergy

## 2018-09-22 ENCOUNTER — Telehealth: Payer: Self-pay | Admitting: Allergy

## 2018-09-22 NOTE — Telephone Encounter (Signed)
Left message for patient or wife to call office about if patient wanted to go back on the [place]. Wife called back and said he didn't want to go back on it now.

## 2018-09-22 NOTE — Telephone Encounter (Signed)
done

## 2018-12-01 ENCOUNTER — Other Ambulatory Visit: Payer: Self-pay | Admitting: *Deleted

## 2018-12-01 MED ORDER — CETIRIZINE HCL 10 MG PO TABS: 10.0000 mg | ORAL_TABLET | Freq: Two times a day (BID) | ORAL | 3 refills | Status: DC

## 2018-12-09 ENCOUNTER — Ambulatory Visit (INDEPENDENT_AMBULATORY_CARE_PROVIDER_SITE_OTHER): Payer: Medicare Other | Admitting: Family Medicine

## 2018-12-09 ENCOUNTER — Other Ambulatory Visit: Payer: Self-pay

## 2018-12-09 ENCOUNTER — Encounter: Payer: Self-pay | Admitting: Family Medicine

## 2018-12-09 DIAGNOSIS — Z794 Long term (current) use of insulin: Secondary | ICD-10-CM

## 2018-12-09 DIAGNOSIS — J4530 Mild persistent asthma, uncomplicated: Secondary | ICD-10-CM

## 2018-12-09 DIAGNOSIS — L500 Allergic urticaria: Secondary | ICD-10-CM | POA: Diagnosis not present

## 2018-12-09 DIAGNOSIS — I10 Essential (primary) hypertension: Secondary | ICD-10-CM | POA: Diagnosis not present

## 2018-12-09 DIAGNOSIS — E119 Type 2 diabetes mellitus without complications: Secondary | ICD-10-CM

## 2018-12-09 DIAGNOSIS — Z79899 Other long term (current) drug therapy: Secondary | ICD-10-CM

## 2018-12-09 DIAGNOSIS — L299 Pruritus, unspecified: Secondary | ICD-10-CM | POA: Insufficient documentation

## 2018-12-09 MED ORDER — MONTELUKAST SODIUM 10 MG PO TABS: 10.0000 mg | ORAL_TABLET | Freq: Every day | ORAL | 5 refills | Status: DC

## 2018-12-09 MED ORDER — CRISABOROLE 2 % EX OINT: 1.0000 "application " | TOPICAL_OINTMENT | Freq: Two times a day (BID) | CUTANEOUS | 3 refills | Status: AC | PRN

## 2018-12-09 MED ORDER — FAMOTIDINE 20 MG PO TABS: 20.0000 mg | ORAL_TABLET | Freq: Two times a day (BID) | ORAL | 3 refills | Status: DC

## 2018-12-09 NOTE — Progress Notes (Addendum)
RE: Robert Cain MRN: [id number] DOB: Jul 25, 1930 Date of Telemedicine Visit: 12/09/2018  Referring provider: Kristopher Glee., MD Primary care provider: Kristopher Glee., MD  Chief Complaint: Rash   Telemedicine Follow Up Visit via Telephone: I connected with Robert Cain for a follow up on 12/09/18 by telephone and verified that I am speaking with the correct person using two identifiers.   I discussed the limitations, risks, security and privacy concerns of performing an evaluation and management service by telephone and the availability of in person appointments. I also discussed with the patient that there may be a patient responsible charge related to this service. The patient expressed understanding and agreed to proceed.  Patient is at home accompanied by his wife who provided/contributed to the history.  Provider is at the office.  Visit start time: 8:38 Visit end time: 9:23 Insurance consent/check in by: Jan Fireman Medical consent and medical assistant/nurse: Katherina Right  History of Present Illness: He is a 85 y.o. male, who is being followed for chronic urticaria, asthma, allergic rhinitis, diabetes and hypertension. His previous allergy office visit was on 09/08/2018 with Gareth Morgan, FNP. At today's visit, he reports that he is not experiencing hives, however, he continues to experience pruritis which occurs in different areas on different days. He reports the itch is worse when he is not busy and has more time to think about the itch and he can go weeks at a time without feeling the itch. He is currently taking cetirizine 10 mg twice a day, using CeraVe lotion and hemp cream daily. He reports that he has restarted clopidogrel as he did not notice a decrease in the amount of itch while he was off this medication.  He has tried clobetasol cream, hydroxyzine, and Sarna lotion with no relief of itch. Allergic rhinitis is reported as well controlled with cetirizine daily and Flonase about  4 times a month. Asthma is reported as moderately well controlled with shortness of breath with activity. He denies wheeze and cough with activity and rest. He is not currently taking montelukast and is using his albuterol inhaler about 4 times a month. His current medications are listed in the chart.  Assessment and Plan: Urticaria/Pruritus Continue Zyrtec 10 mg-take 1 tablet at night for itching.  You may take 1 tablet twice a day if needed  Continue to use Dreft or Ivory detergent.  Do not use bleaches or softeners Begin Eucrisa to red itchy areas twice a day as needed Add montelukast 10 mg once a day Add famotidine 20 mg twice a day Avoid large amounts of tomato. Continue to avoid chocolate If your symptoms recur, begin a journal of events that occurred for up to 6 hours before your symptoms began including foods and beverages consumed, soaps or perfumes you had contact with, and medications.  Check in with your family doctor about your kidney function  Asthma Proventil 2 puffs every 4 hours if needed for wheezing or coughing spells Begin montelukast 10 mg once a day (as above) Continue on his other medications Begin exercise routine  Call us if this treatment plan is not working well for toy  Follow up in 4 months or sooner if needed  Return in about 4 months (around 04/11/2019), or if symptoms worsen or fail to improve.  Meds ordered this encounter  Medications  . montelukast (SINGULAIR) 10 MG tablet    Sig: Take 1 tablet (10 mg total) by mouth at bedtime.    Dispense:  30  tablet    Refill:  5  . Crisaborole (EUCRISA) 2 % OINT    Sig: Apply 1 application topically 2 (two) times daily as needed (to affected areas).    Dispense:  60 g    Refill:  3  . famotidine (PEPCID) 20 MG tablet    Sig: Take 1 tablet (20 mg total) by mouth 2 (two) times daily.    Dispense:  60 tablet    Refill:  3    Medication List:  Current Outpatient Medications  Medication Sig Dispense Refill  .  albuterol (PROAIR HFA) 108 (90 Base) MCG/ACT inhaler Inhale 2 puffs into the lungs every 4 (four) hours as needed for wheezing or shortness of breath. 1 Inhaler 2  . albuterol (VENTOLIN HFA) 108 (90 Base) MCG/ACT inhaler Inhale 2 puffs into the lungs every 4 (four) hours as needed for wheezing or shortness of breath. 1 Inhaler 2  . aspirin EC 81 MG tablet Take by mouth daily.     . ASSURE COMFORT LANCETS 28G MISC Check blood sugar 3 times daily.    . cetirizine (ZYRTEC) 10 MG tablet Take 1 tablet (10 mg total) by mouth 2 (two) times daily. 30 tablet 3  . clopidogrel (PLAVIX) 75 MG tablet     . DOCOSAHEXAENOIC ACID PO Take by mouth.    . ferrous sulfate 325 (65 FE) MG tablet Take 325 mg by mouth.    . finasteride (PROSCAR) 5 MG tablet   3  . fluticasone (FLONASE) 50 MCG/ACT nasal spray Two sprays each nostril at night after doing nasal saline irrigations. 16 g 5  . furosemide (LASIX) 40 MG tablet TK 1 T PO ONCE D  1  . insulin aspart (NOVOLOG) 100 UNIT/ML injection Inject 18 Units into the skin. 18 units for breakfast and for dinner and 28 units at bedtime.    . Insulin Aspart, w/Niacinamide, (FIASP FLEXTOUCH) 100 UNIT/ML SOPN Inject 15 units twice per day    . Insulin Pen Needle (EXEL COMFORT POINT PEN NEEDLE) 29G X 12MM MISC Use with insulin pens 3 times per day    . LANTUS SOLOSTAR 100 UNIT/ML Solostar Pen 28 Units. 28 units at night  3  . lisinopril (ZESTRIL) 5 MG tablet Take by mouth.    . losartan (COZAAR) 25 MG tablet Take 1 tablet by mouth once daily    . LUTEIN-ZEAXANTHIN PO Take by mouth.    . Magnesium Oxide (MAG-OXIDE PO) Take 500 mg by mouth.    . metFORMIN (GLUCOPHAGE-XR) 500 MG 24 hr tablet TAKE 1 TABLET BY MOUTH TWICE DAILY 30 MINUTES BEFORE A MEAL    . metoprolol succinate (TOPROL-XL) 50 MG 24 hr tablet TK 1 T PO D  2  . nitroGLYCERIN (NITROSTAT) 0.4 MG SL tablet Place under the tongue.    Marland Kitchen NOVOLOG MIX 70/30 FLEXPEN (70-30) 100 UNIT/ML FlexPen   1  . nystatin ointment  (MYCOSTATIN) APPLY ON THE SKIN BID    . Omega-3 1000 MG CAPS Take by mouth.    . potassium chloride SA (K-DUR,KLOR-CON) 20 MEQ tablet Take by mouth.    . pravastatin (PRAVACHOL) 40 MG tablet TK 1 T PO QPM  0  . clobetasol cream (TEMOVATE) 0.05 % APP EXT AA BID FOR ITCHY RASH    . Crisaborole (EUCRISA) 2 % OINT Apply 1 application topically 2 (two) times daily as needed (to affected areas). 60 g 3  . doxepin (SINEQUAN) 10 MG capsule     . famotidine (PEPCID) 20  MG tablet Take 1 tablet (20 mg total) by mouth 2 (two) times daily. 60 tablet 3  . Fexofenadine HCl (ALLEGRA ALLERGY PO) Take by mouth.    . hydrOXYzine (ATARAX/VISTARIL) 10 MG tablet TK 1 T PO Q 4-6 H PRN    . LORazepam (ATIVAN) 0.5 MG tablet TK 1 T PO HS PRN    . montelukast (SINGULAIR) 10 MG tablet Take 1 tablet (10 mg total) by mouth at bedtime. 30 tablet 5   No current facility-administered medications for this visit.    Allergies: Allergies  Allergen Reactions  . Penicillins Hives and Rash    Other reaction(s): RASH   . Codeine Rash    Other reaction(s): RASH   . Morphine Rash    Other reaction(s): RASH    I reviewed his past medical history, social history, family history, and environmental history and no significant changes have been reported from previous visit on 09/08/2018.  Objective: Physical Exam Not obtained as encounter was done via telephone.   Previous notes and tests were reviewed.  I discussed the assessment and treatment plan with the patient. The patient was provided an opportunity to ask questions and all were answered. The patient agreed with the plan and demonstrated an understanding of the instructions.   The patient was advised to call back or seek an in-person evaluation if the symptoms worsen or if the condition fails to improve as anticipated.  I provided 45 minutes of non-face-to-face time during this encounter.  It was my pleasure to participate in [place] Vandenbrink's care today. Please feel  free to contact me with any questions or concerns.   Sincerely,  Gareth Morgan, FNP   I have provided oversight concerning Gareth Morgan' evaluation and treatment of this patient's health issues addressed during today's encounter. I agree with the assessment and therapeutic plan as outlined in the note.   Thank you for the opportunity to care for this patient.  Please do not hesitate to contact me with questions.  Penne Lash, M.D.  [facility] [address] [place], [place] [address] [phone]

## 2018-12-09 NOTE — Patient Instructions (Addendum)
Urticaria/Pruritus Continue Zyrtec 10 mg-take 1 tablet at night for itching.  You may take 1 tablet twice a day if needed  Continue to use Dreft or Ivory detergent.  Do not use bleaches or softeners Begin Eucrisa to red itchy areas twice a day as needed Add montelukast 10 mg once a day Add famotidine 20 mg twice a day Avoid large amounts of tomato. Continue to avoid chocolate If your symptoms recur, begin a journal of events that occurred for up to 6 hours before your symptoms began including foods and beverages consumed, soaps or perfumes you had contact with, and medications.  Check in with your family doctor about your kidney function  Asthma Proventil 2 puffs every 4 hours if needed for wheezing or coughing spells Begin montelukast 10 mg once a day (as above) Continue on his other medications Begin exercise routine  Call us if this treatment plan is not working well for toy  Follow up in 4 months or sooner if needed

## 2018-12-16 ENCOUNTER — Telehealth: Payer: Self-pay | Admitting: *Deleted

## 2018-12-16 NOTE — Telephone Encounter (Signed)
PA was denied for eucrisa 2% ointment. Preferred is betamethasone dipropionate ointment, triamcinolone ointment, mometasone, tacrolimus ointment. Please advise.

## 2018-12-17 MED ORDER — TRIAMCINOLONE ACETONIDE 0.1 % EX CREA: 1.0000 "application " | TOPICAL_CREAM | Freq: Two times a day (BID) | CUTANEOUS | 3 refills | Status: DC | PRN

## 2018-12-17 NOTE — Telephone Encounter (Signed)
If he tries and fails one of these can we appeal? Let's try triamcinolone 0.1% cream please

## 2018-12-17 NOTE — Telephone Encounter (Signed)
Pt informed that triamcinolone was sent. They will call us if he does not improve.

## 2019-01-09 ENCOUNTER — Telehealth: Payer: Self-pay | Admitting: Pediatrics

## 2019-01-09 NOTE — Telephone Encounter (Signed)
Webb Silversmith and [place] are not in clinic, routing to provider in clinic.

## 2019-01-09 NOTE — Telephone Encounter (Signed)
Also- I did offer apt with anne today but pt declined.

## 2019-01-09 NOTE — Telephone Encounter (Signed)
Robert Cain was wanting to see Dr. Shaune Leeks however I informed him Dr. Shaune Leeks in not in the clinic today. The lump under his left ear is not bothersome or warm to touch. I told him that to me it sounds like a swollen lymph node and that his body may be trying to fight off something. He asked if it was related to his diabetes and that his levels had been up and I told him to speak with his pcp or diabetic doctor regarding that. I told him to wait until over the weekend to see if it goes down on its own and if not or becomes worse to call us on Monday because Dr. Shaune Leeks will be back in the clinic Monday. Pt understood.

## 2019-01-09 NOTE — Telephone Encounter (Signed)
Call patient back and have him schedule a telephone visit with Webb Silversmith. Her schedule is open in the afternoon.  Thank you.

## 2019-01-09 NOTE — Telephone Encounter (Signed)
PT called to report a bump under left ear that extends down 2-3 inches, swollen and tender but not too painful. first noticed wednesday afternoon while shaving face. pt has diabetes and sugar has been high for last two days. PT can be reached at [phone], but he will be unavailable while at work between 9:30-1:45.

## 2019-02-13 ENCOUNTER — Other Ambulatory Visit: Payer: Self-pay

## 2019-02-13 MED ORDER — CETIRIZINE HCL 10 MG PO TABS: 10.0000 mg | ORAL_TABLET | Freq: Two times a day (BID) | ORAL | 3 refills | Status: DC

## 2019-02-13 NOTE — Telephone Encounter (Signed)
Refill zyrtec 10 mg with 3 refills.

## 2019-03-03 ENCOUNTER — Other Ambulatory Visit: Payer: Self-pay | Admitting: *Deleted

## 2019-03-03 MED ORDER — CETIRIZINE HCL 10 MG PO TABS: 10.0000 mg | ORAL_TABLET | Freq: Two times a day (BID) | ORAL | 3 refills | Status: DC

## 2019-03-27 ENCOUNTER — Other Ambulatory Visit: Payer: Self-pay | Admitting: Family Medicine

## 2019-05-04 ENCOUNTER — Other Ambulatory Visit: Payer: Self-pay

## 2019-05-04 ENCOUNTER — Ambulatory Visit (INDEPENDENT_AMBULATORY_CARE_PROVIDER_SITE_OTHER): Payer: Medicare Other | Admitting: Pediatrics

## 2019-05-04 ENCOUNTER — Encounter: Payer: Self-pay | Admitting: Pediatrics

## 2019-05-04 VITALS — BP 106/66 | HR 68 | Temp 97.4°F | Resp 24

## 2019-05-04 DIAGNOSIS — Z79899 Other long term (current) drug therapy: Secondary | ICD-10-CM

## 2019-05-04 DIAGNOSIS — J4530 Mild persistent asthma, uncomplicated: Secondary | ICD-10-CM

## 2019-05-04 DIAGNOSIS — H60393 Other infective otitis externa, bilateral: Secondary | ICD-10-CM | POA: Diagnosis not present

## 2019-05-04 DIAGNOSIS — E119 Type 2 diabetes mellitus without complications: Secondary | ICD-10-CM

## 2019-05-04 DIAGNOSIS — I10 Essential (primary) hypertension: Secondary | ICD-10-CM

## 2019-05-04 DIAGNOSIS — Z794 Long term (current) use of insulin: Secondary | ICD-10-CM

## 2019-05-04 MED ORDER — NEOMYCIN-POLYMYXIN-HC 3.5-10000-1 OT SUSP: OTIC | 0 refills | Status: DC

## 2019-05-04 NOTE — Progress Notes (Signed)
[address] [place] [address] Dept: [phone]  FOLLOW UP NOTE  Patient ID: Robert Cain, male    DOB: 1930/07/06  Age: 85 y.o. MRN: [id number] Date of Office Visit: 05/04/2019  Assessment  Chief Complaint: Pruritis  HPI Robert Cain presents for evaluation of itchy ears for several weeks.  He has used garlic oil drops without any help.  At one time several years ago They  did help.  He is on cetirizine 10 mg once a day.  He is not having any itching whatsoever.  He is not using any creams on his skin.  His kidney function is normal.  His diabetes is well controlled.  His asthma is well controlled with the use of montelukast 10 mg once a day Other current medications are outlined in the chart   Drug Allergies:  Allergies  Allergen Reactions  . Penicillins Hives and Rash    Other reaction(s): RASH   . Codeine Rash    Other reaction(s): RASH   . Morphine Rash    Other reaction(s): RASH     Physical Exam: BP 106/66 (BP Location: Left Arm, Patient Position: Sitting, Cuff Size: Normal)   Pulse 68   Temp (!) 97.4 F (36.3 C) (Oral)   Resp (!) 24   SpO2 95%    Physical Exam Vitals signs reviewed.  Constitutional:      Appearance: Normal appearance. He is normal weight.  HENT:     Head:     Comments: Eyes normal.  Ears showed otitis externa in the ear canals.  Nose normal.  Pharynx normal. Neck:     Musculoskeletal: Neck supple.  Cardiovascular:     Rate and Rhythm: Normal rate and regular rhythm.     Comments: S1-S2 normal no murmurs Lymphadenopathy:     Cervical: No cervical adenopathy.  Skin:    Comments: Clear  Neurological:     General: No focal deficit present.     Mental Status: He is alert and oriented to person, place, and time. Mental status is at baseline.  Psychiatric:        Mood and Affect: Mood normal.        Behavior: Behavior normal.        Thought Content: Thought content normal.        Judgment: Judgment normal.      Diagnostics:    Assessment and Plan: 1. Other infective acute otitis externa of both ears   2. Insulin dependent type 2 diabetes mellitus ([place])   3. Essential hypertension   4. Current use of beta blocker   5. Mild persistent asthma without complication     Meds ordered this encounter  Medications  . neomycin-polymyxin-hydrocortisone (CORTISPORIN) 3.5-10000-1 OTIC suspension    Sig: Use 4 drops in each ear 3 times a day for a week.    Dispense:  10 mL    Refill:  0    Patient Instructions  Zyrtec 10 mg-take 1 tablet once a day for itching Cortisporin otic suspension-use 4 drops in each ear 3 times a day for a week .  This will take care of any fungal involvement  Continue on your other medications Call us if you are not doing well on this treatment plan   Return in about 6 months (around 11/01/2019).    Thank you for the opportunity to care for this patient.  Please do not hesitate to contact me with questions.  Penne Lash, M.D.  [facility] [place]  [place], [place] [address] [phone]

## 2019-05-04 NOTE — Patient Instructions (Addendum)
Zyrtec 10 mg-take 1 tablet once a day for itching Cortisporin otic suspension-use 4 drops in each ear 3 times a day for a week .  This will take care of any fungal involvement  Continue on your other medications Call us if you are not doing well on this treatment plan

## 2019-05-12 ENCOUNTER — Emergency Department (HOSPITAL_BASED_OUTPATIENT_CLINIC_OR_DEPARTMENT_OTHER): Payer: Medicare Other

## 2019-05-12 ENCOUNTER — Other Ambulatory Visit: Payer: Self-pay

## 2019-05-12 ENCOUNTER — Emergency Department (HOSPITAL_BASED_OUTPATIENT_CLINIC_OR_DEPARTMENT_OTHER)
Admission: EM | Admit: 2019-05-12 | Discharge: 2019-05-12 | Disposition: A | Discharge status: Home / Self Care | Payer: Medicare Other | Attending: Emergency Medicine | Admitting: Emergency Medicine

## 2019-05-12 ENCOUNTER — Encounter (HOSPITAL_BASED_OUTPATIENT_CLINIC_OR_DEPARTMENT_OTHER): Payer: Self-pay

## 2019-05-12 ENCOUNTER — Emergency Department (HOSPITAL_BASED_OUTPATIENT_CLINIC_OR_DEPARTMENT_OTHER)
Admission: EM | Admit: 2019-05-12 | Discharge: 2019-05-12 | Discharge status: Absent without leave | Payer: Medicare Other | Attending: Emergency Medicine | Admitting: Emergency Medicine

## 2019-05-12 DIAGNOSIS — R55 Syncope and collapse: Secondary | ICD-10-CM | POA: Diagnosis present

## 2019-05-12 DIAGNOSIS — J45909 Unspecified asthma, uncomplicated: Secondary | ICD-10-CM | POA: Insufficient documentation

## 2019-05-12 DIAGNOSIS — Z7982 Long term (current) use of aspirin: Secondary | ICD-10-CM | POA: Insufficient documentation

## 2019-05-12 DIAGNOSIS — I10 Essential (primary) hypertension: Secondary | ICD-10-CM | POA: Diagnosis not present

## 2019-05-12 DIAGNOSIS — Z79899 Other long term (current) drug therapy: Secondary | ICD-10-CM | POA: Insufficient documentation

## 2019-05-12 DIAGNOSIS — N3000 Acute cystitis without hematuria: Secondary | ICD-10-CM | POA: Diagnosis not present

## 2019-05-12 LAB — URINALYSIS, ROUTINE W REFLEX MICROSCOPIC
Bilirubin Urine: NEGATIVE
Glucose, UA: NEGATIVE mg/dL
Ketones, ur: NEGATIVE mg/dL
Nitrite: POSITIVE — AB
Protein, ur: NEGATIVE mg/dL
Specific Gravity, Urine: 1.02 (ref 1.005–1.030)
pH: 5.5 (ref 5.0–8.0)

## 2019-05-12 LAB — CBC WITH DIFFERENTIAL/PLATELET
Abs Immature Granulocytes: 0.02 10*3/uL (ref 0.00–0.07)
Basophils Absolute: 0 10*3/uL (ref 0.0–0.1)
Basophils Relative: 1 %
Eosinophils Absolute: 0.2 10*3/uL (ref 0.0–0.5)
Eosinophils Relative: 3 %
HCT: 37.2 % — ABNORMAL LOW (ref 39.0–52.0)
Hemoglobin: 11.9 g/dL — ABNORMAL LOW (ref 13.0–17.0)
Immature Granulocytes: 0 %
Lymphocytes Relative: 11 %
Lymphs Abs: 0.7 10*3/uL (ref 0.7–4.0)
MCH: 29.3 pg (ref 26.0–34.0)
MCHC: 32 g/dL (ref 30.0–36.0)
MCV: 91.6 fL (ref 80.0–100.0)
Monocytes Absolute: 0.8 10*3/uL (ref 0.1–1.0)
Monocytes Relative: 13 %
Neutro Abs: 4.7 10*3/uL (ref 1.7–7.7)
Neutrophils Relative %: 72 %
Platelets: 158 10*3/uL (ref 150–400)
RBC: 4.06 MIL/uL — ABNORMAL LOW (ref 4.22–5.81)
RDW: 14 % (ref 11.5–15.5)
WBC: 6.5 10*3/uL (ref 4.0–10.5)
nRBC: 0 % (ref 0.0–0.2)

## 2019-05-12 LAB — COMPREHENSIVE METABOLIC PANEL
ALT: 20 U/L (ref 0–44)
AST: 20 U/L (ref 15–41)
Albumin: 3.3 g/dL — ABNORMAL LOW (ref 3.5–5.0)
Alkaline Phosphatase: 88 U/L (ref 38–126)
Anion gap: 12 (ref 5–15)
BUN: 28 mg/dL — ABNORMAL HIGH (ref 8–23)
CO2: 26 mmol/L (ref 22–32)
Calcium: 8.5 mg/dL — ABNORMAL LOW (ref 8.9–10.3)
Chloride: 96 mmol/L — ABNORMAL LOW (ref 98–111)
Creatinine, Ser: 1.81 mg/dL — ABNORMAL HIGH (ref 0.61–1.24)
GFR calc Af Amer: 38 mL/min — ABNORMAL LOW (ref >60)
GFR calc non Af Amer: 33 mL/min — ABNORMAL LOW (ref >60)
Glucose, Bld: 184 mg/dL — ABNORMAL HIGH (ref 70–99)
Potassium: 3.3 mmol/L — ABNORMAL LOW (ref 3.5–5.1)
Sodium: 134 mmol/L — ABNORMAL LOW (ref 135–145)
Total Bilirubin: 0.9 mg/dL (ref 0.3–1.2)
Total Protein: 6.2 g/dL — ABNORMAL LOW (ref 6.5–8.1)

## 2019-05-12 LAB — CBG MONITORING, ED: Glucose-Capillary: 201 mg/dL — ABNORMAL HIGH (ref 70–99)

## 2019-05-12 LAB — URINALYSIS, MICROSCOPIC (REFLEX)

## 2019-05-12 MED ORDER — SULFAMETHOXAZOLE-TRIMETHOPRIM 800-160 MG PO TABS
1.0000 | ORAL_TABLET | Freq: Once | ORAL | Status: AC
  Administered 2019-05-12: 1 via ORAL
  Filled 2019-05-12: qty 1

## 2019-05-12 MED ORDER — SULFAMETHOXAZOLE-TRIMETHOPRIM 800-160 MG PO TABS: 1.0000 | ORAL_TABLET | Freq: Two times a day (BID) | ORAL | 0 refills | Status: AC

## 2019-05-12 MED ORDER — SODIUM CHLORIDE 0.9 % IV BOLUS (SEPSIS)
500.0000 mL | Freq: Once | INTRAVENOUS | Status: AC
  Administered 2019-05-12: 500 mL via INTRAVENOUS

## 2019-05-12 MED ORDER — SODIUM CHLORIDE 0.9 % IV SOLN
1000.0000 mL | INTRAVENOUS | Status: DC
  Administered 2019-05-12: 1000 mL via INTRAVENOUS

## 2019-05-12 NOTE — ED Notes (Signed)
Loop monitor interrogation finished.

## 2019-05-12 NOTE — ED Provider Notes (Signed)
[facility] EMERGENCY DEPARTMENT Provider Note   CSN: [id number] Arrival date & time: 05/12/19  1924     History   Chief Complaint No chief complaint on file.   HPI Robert Cain is a 85 y.o. male.     HPI Pt had been out to dinner tonight.   After finishing the meal he reached over for his cane.  He bent down and when he went to sit up he felt extremely dizzy.  Wife states after sitting up pt then passed out.  He was extremely pale and his lips looked blue.  Pt could not respond.  Wife asked someone to call 911.  Bystanders were unable to find a pulse (one was reportedly a nurse).  Pt remained unresponsive for maybe 10 minutes.   Pt regained consciousness at the restaurant.  Per EMS report pt's initial BP was low.  Pt states he feels fine now.    Pt has had some chills recently.  He has been urinating frequently but not urinating much.   Past Medical History:  Diagnosis Date  . Asthma   . Hypercholesteremia   . Hypertension     Patient Active Problem List   Diagnosis Date Noted  . Pruritus 12/09/2018  . Mild persistent asthma without complication 123456  . Mild intermittent asthma without complication 0000000  . Allergic urticaria 08/12/2018  . Mild persistent asthma with acute exacerbation 03/31/2018  . Reactive airway disease 08/28/2017  . Acute non-recurrent maxillary sinusitis 08/28/2017  . Insulin dependent type 2 diabetes mellitus ([place]) 08/28/2017  . Restrictive lung disease 08/28/2017  . OSA (obstructive sleep apnea) 05/02/2017  . Vitamin D deficiency 07/15/2016  . Insulin dependent diabetes mellitus 07/11/2016  . Metatarsalgia of both feet 03/27/2016  . Toe pain, right 03/27/2016  . Hypoglycemia associated with type 2 diabetes mellitus ([place]) 03/23/2016  . Dizziness 03/01/2016  . Hyperlipidemia LDL goal <70 09/02/2015  . Peripheral edema 05/13/2015  . Shortness of breath 05/13/2015  . Current use of beta blocker 05/11/2015  . Status post eye  surgery 04/17/2015  . Abnormal gait 03/29/2014  . Allergic rhinitis 03/29/2014  . Belching 03/29/2014  . Benign neoplasm of colon 03/29/2014  . Benign prostatic hyperplasia 03/29/2014  . Chronic coronary artery disease 03/29/2014  . Disorder of bursae and tendons in shoulder region 03/29/2014  . Elevated prostate specific antigen (PSA) 03/29/2014  . Essential hypertension 03/29/2014  . Gastroesophageal reflux disease without esophagitis 03/29/2014  . Hammer toe 03/29/2014  . Hyperlipidemia associated with type 2 diabetes mellitus ([place]) 03/29/2014  . Hypomagnesemia 03/29/2014  . Incomplete emptying of bladder 03/29/2014  . Left-sided chest wall pain 03/29/2014  . Microscopic hematuria 03/29/2014  . Nocturia 03/29/2014  . Onychomycosis due to dermatophyte 03/29/2014  . Osteoarthrosis, generalized, involving multiple sites 03/29/2014  . Osteoporosis, senile 03/29/2014  . Overweight 03/29/2014  . Patellofemoral syndrome 03/29/2014  . Plantar fasciitis 03/29/2014  . Primary osteoarthritis of right knee 03/29/2014  . Right foot drop 03/29/2014  . Sensorineural hearing loss 03/29/2014  . Sleep related leg cramps 03/29/2014  . Transient cerebral ischemia 03/29/2014  . Traumatic arthropathy of left elbow 03/29/2014  . Primary localized osteoarthrosis of shoulder 01/31/2014  . Pain due to total knee replacement ([place]) 12/23/2013  . S/P total knee arthroplasty 12/20/2013  . Transient synovitis of left knee 12/20/2013  . Shoulder joint pain 01/10/2013  . History of corneal transplant 06/16/2012  . Fuchs' corneal dystrophy 10/29/2011  . Nonexudative age-related macular degeneration 10/19/2011  . Pseudophakia  of both eyes 10/19/2011    Past Surgical History:  Procedure Laterality Date  . ANKLE FRACTURE SURGERY    . APPENDECTOMY    . REPLACEMENT TOTAL KNEE          Home Medications    Prior to Admission medications   Medication Sig Start Date End Date Taking? Authorizing  Provider  albuterol (VENTOLIN HFA) 108 (90 Base) MCG/ACT inhaler Inhale 2 puffs into the lungs every 4 (four) hours as needed for wheezing or shortness of breath. 04/15/18   Charlies Silvers, MD  aspirin EC 81 MG tablet Take by mouth daily.     [provider]  ASSURE COMFORT LANCETS 28G MISC Check blood sugar 3 times daily. 01/08/17   [provider]  cetirizine (ZYRTEC) 10 MG tablet Take 1 tablet (10 mg total) by mouth 2 (two) times daily. 03/03/19   Dara Hoyer, FNP  clobetasol cream (TEMOVATE) 0.05 % APP EXT AA BID FOR ITCHY RASH 04/23/18   [provider]  Crisaborole (EUCRISA) 2 % OINT Apply 1 application topically 2 (two) times daily as needed (to affected areas). Patient not taking: Reported on 05/04/2019 12/09/18   Dara Hoyer, FNP  Cyanocobalamin (VITAMIN B-12 PO) Take 5,000 mg by mouth.    [provider]  DOCOSAHEXAENOIC ACID PO Take by mouth.    [provider]  doxepin (SINEQUAN) 10 MG capsule  08/27/18   [provider]  famotidine (PEPCID) 20 MG tablet TAKE 1 TABLET(20 MG) BY MOUTH TWICE DAILY Patient not taking: Reported on 05/04/2019 03/27/19   Dara Hoyer, FNP  ferrous sulfate 325 (65 FE) MG tablet Take 325 mg by mouth.    [provider]  Fexofenadine HCl (ALLEGRA ALLERGY PO) Take by mouth.    [provider]  finasteride (PROSCAR) 5 MG tablet  06/17/17   [provider]  fluticasone (FLONASE) 50 MCG/ACT nasal spray Two sprays each nostril at night after doing nasal saline irrigations. 08/26/17   Charlies Silvers, MD  furosemide (LASIX) 40 MG tablet TK 1 T PO ONCE D 06/03/17   [provider]  Insulin Pen Needle (EXEL COMFORT POINT PEN NEEDLE) 29G X 12MM MISC Use with insulin pens 3 times per day 04/25/15   [provider]  LANTUS SOLOSTAR 100 UNIT/ML Solostar Pen 28 Units. 28 units at night 08/24/17   [provider]  lisinopril (ZESTRIL) 5 MG tablet Take by mouth. 09/10/18    [provider]  losartan (COZAAR) 25 MG tablet Take 1 tablet by mouth once daily 12/24/16   [provider]  LUTEIN-ZEAXANTHIN PO Take by mouth.    [provider]  Magnesium Oxide (MAG-OXIDE PO) Take 500 mg by mouth.    [provider]  metFORMIN (GLUCOPHAGE-XR) 500 MG 24 hr tablet TAKE 1 TABLET BY MOUTH TWICE DAILY 30 MINUTES BEFORE A MEAL 03/13/17   [provider]  metoprolol succinate (TOPROL-XL) 25 MG 24 hr tablet Take by mouth. 03/30/19 06/28/19  [provider]  montelukast (SINGULAIR) 10 MG tablet Take 1 tablet (10 mg total) by mouth at bedtime. 12/09/18   Ambs, Kathrine Cords, FNP  neomycin-polymyxin-hydrocortisone (CORTISPORIN) 3.5-10000-1 OTIC suspension Use 4 drops in each ear 3 times a day for a week. 05/04/19   Charlies Silvers, MD  nitroGLYCERIN (NITROSTAT) 0.4 MG SL tablet Place under the tongue.    [provider]  NOVOLOG FLEXPEN 100 UNIT/ML FlexPen INJECT 15-20 UNITS BID UNDER THE SKIN BEFORE MEALS 01/05/19  [provider]  Omega-3 1000 MG CAPS Take by mouth.    [provider]  potassium chloride SA (K-DUR,KLOR-CON) 20 MEQ tablet Take by mouth. 05/16/17   [provider]  pravastatin (PRAVACHOL) 40 MG tablet TK 1 T PO QPM 08/01/17   [provider]  torsemide (DEMADEX) 10 MG tablet Take 10 mg by mouth daily.    [provider]  vitamin C (ASCORBIC ACID) 500 MG tablet Take 500 mg by mouth daily.    [provider]    Family History Family History  Problem Relation Age of Onset  . Allergic rhinitis Neg Hx   . Angioedema Neg Hx   . Asthma Neg Hx   . Eczema Neg Hx   . Immunodeficiency Neg Hx   . Urticaria Neg Hx     Social History Social History   Tobacco Use  . Smoking status: Never Smoker  . Smokeless tobacco: Never Used  Substance Use Topics  . Alcohol use: No    Frequency: Never  . Drug use: No     Allergies   Penicillins, Codeine, and Morphine    Review of Systems Review of Systems  Constitutional: Negative for fever.  Cardiovascular: Negative for chest pain.  Gastrointestinal: Negative for abdominal pain.  Genitourinary: Positive for dysuria.  Neurological: Negative for weakness and headaches.  All other systems reviewed and are negative.    Physical Exam Updated Vital Signs BP 131/70   Pulse 87   Temp (!) 97.5 F (36.4 C) (Oral)   Resp (!) 21   Ht 1.93 m (6\' 4" )   Wt 113.4 kg   SpO2 96%   BMI 30.43 kg/m   Physical Exam Vitals signs and nursing note reviewed.  Constitutional:      General: He is not in acute distress.    Appearance: He is well-developed.  HENT:     Head: Normocephalic and atraumatic.     Right Ear: External ear normal.     Left Ear: External ear normal.  Eyes:     General: No scleral icterus.       Right eye: No discharge.        Left eye: No discharge.     Conjunctiva/sclera: Conjunctivae normal.  Neck:     Musculoskeletal: Neck supple.     Trachea: No tracheal deviation.  Cardiovascular:     Rate and Rhythm: Normal rate and regular rhythm.  Pulmonary:     Effort: Pulmonary effort is normal. No respiratory distress.     Breath sounds: Normal breath sounds. No stridor. No wheezing or rales.  Abdominal:     General: Bowel sounds are normal. There is no distension.     Palpations: Abdomen is soft.     Tenderness: There is no abdominal tenderness. There is no guarding or rebound.  Musculoskeletal:        General: No tenderness.  Skin:    General: Skin is warm and dry.     Findings: No rash.  Neurological:     Mental Status: He is alert and oriented to person, place, and time.     Cranial Nerves: No cranial nerve deficit (No facial droop, extraocular movements intact, tongue midline ).     Sensory: No sensory deficit.     Motor: No abnormal muscle tone or seizure activity.     Coordination: Coordination normal.     Comments: No pronator drift bilateral upper extrem, able to hold both  legs off bed for 5 seconds, sensation  intact in all extremities, no visual field cuts, no left or right sided neglect, normal finger-nose exam bilaterally, no nystagmus noted       ED Treatments / Results  Labs (all labs ordered are listed, but only abnormal results are displayed) Labs Reviewed  CBC WITH DIFFERENTIAL/PLATELET - Abnormal; Notable for the following components:      Result Value   RBC 4.06 (*)    Hemoglobin 11.9 (*)    HCT 37.2 (*)    All other components within normal limits  COMPREHENSIVE METABOLIC PANEL - Abnormal; Notable for the following components:   Sodium 134 (*)    Potassium 3.3 (*)    Chloride 96 (*)    Glucose, Bld 184 (*)    BUN 28 (*)    Creatinine, Ser 1.81 (*)    Calcium 8.5 (*)    Total Protein 6.2 (*)    Albumin 3.3 (*)    GFR calc non Af Amer 33 (*)    GFR calc Af Amer 38 (*)    All other components within normal limits  URINALYSIS, ROUTINE W REFLEX MICROSCOPIC - Abnormal; Notable for the following components:   Hgb urine dipstick TRACE (*)    Nitrite POSITIVE (*)    Leukocytes,Ua SMALL (*)    All other components within normal limits  URINALYSIS, MICROSCOPIC (REFLEX) - Abnormal; Notable for the following components:   Bacteria, UA MANY (*)    All other components within normal limits  CBG MONITORING, ED - Abnormal; Notable for the following components:   Glucose-Capillary 201 (*)    All other components within normal limits    EKG EKG Interpretation  Date/Time:  Tuesday May 12 2019 20:13:48 EDT Ventricular Rate:  86 PR Interval:    QRS Duration: 172 QT Interval:  414 QTC Calculation: 496 R Axis:   -71 Text Interpretation:  Sinus or ectopic atrial rhythm Prolonged PR interval RBBB and LAFB rbbb noted on prior ECG Confirmed by Dorie Rank [phone]) on 05/12/2019 8:24:06 PM   Radiology Dg Chest Port 1 View  Result Date: 05/12/2019 CLINICAL DATA:  Syncope EXAM: PORTABLE CHEST 1 VIEW COMPARISON:  None. FINDINGS: Aortic knob  calcifications. There is borderline enlargement of the cardiac silhouette. Both lungs are clear. The visualized skeletal structures are unremarkable. IMPRESSION: No acute cardiopulmonary process. Electronically Signed   By: Prudencio Pair M.D.   On: 05/12/2019 20:26    Procedures Procedures (including critical care time)  Medications Ordered in ED Medications  sodium chloride 0.9 % bolus 500 mL (0 mLs Intravenous Stopped 05/12/19 2157)    Followed by  0.9 %  sodium chloride infusion ( Intravenous Stopped 05/12/19 2137)     Initial Impression / Assessment and Plan / ED Course  I have reviewed the triage vital signs and the nursing notes.  Pertinent labs & imaging results that were available during my care of the patient were reviewed by me and considered in my medical decision making (see chart for details).  Clinical Course as of May 11 2214  Tue May 12, 2019  2156 Labs are notable for urinary tract infection.  Electrolyte panel does show elevation in BUN and creatinine.  Mild decrease in hemoglobin but doubt this is clinically significant.   [JK]  2214 Patient's loop recorder was interrogated.  No arrhythmia detected.  Battery life is good.   [JK]  2214 Discussed laboratory tests and findings with patient and wife.  Elevated BUN and creatinine noted.  Urinalysis consistent with UTI.  No  old labs were available for comparison.   [JK]  2214 Patient has continued to feel well.  He was able to walk to the bathroom.   [JK]    Clinical Course User Index [JK] Dorie Rank, MD     Patient presented to the ED for evaluation of a syncopal episode.  Patient had noted some urinary frequency recently.  His ED evaluation is consistent with a urinary tract infection.  He is afebrile without significant leukocytosis.  No findings to suggest sepsis.  Patient's loop recorder does not show evidence of dysrhythmia.  Patient was bending over when this episode occurred.  It is possible he had an episode of  orthostatic hypotension.  Considering his age his degree of hypotension and his symptoms I did recommend he be admitted to the hospital for monitoring and treatment of his urinary tract infection.  Patient states he does not want to be admitted to the hospital.  I explained to him that I recommended that he had be admitted because he may feel worse and have recurrent syncopal episodes at home.  Patient understands this and still would like to go home.  He was able to walk in the ED without difficulty.  His wife is with him and they both understand that he can return at any time if he changes his mind.  I did tell him he needs to watch for fevers chills or recurrent episodes of lightheadedness.  Final Clinical Impressions(s) / ED Diagnoses   Final diagnoses:  Acute cystitis without hematuria  Syncope, unspecified syncope type    ED Discharge Orders    None       Dorie Rank, MD 05/12/19 2218

## 2019-05-12 NOTE — ED Notes (Signed)
Pt on monitor 

## 2019-05-12 NOTE — ED Triage Notes (Signed)
Pt wife states they were out to dinner at [facility] and pt bent over to pick up cane and when he tried to get up he got dizzy and lost con. For approx 5 mins, wife states he had blue lips and was very pale, incont. increased fatigue x3 weeks.  EMD BP 80/50, cbg 167, 873mL NS 102/56 after NS  Hx syncope/htn/stents, DM

## 2019-05-12 NOTE — Discharge Instructions (Signed)
Take the antibiotics as prescribed.  Return to the emergency room immediately if you change your mind about being admitted to have especially started having fevers, chills or recurrent episodes of weakness.

## 2019-05-14 LAB — URINE CULTURE: Culture: <10000 — AB

## 2019-05-18 ENCOUNTER — Ambulatory Visit (INDEPENDENT_AMBULATORY_CARE_PROVIDER_SITE_OTHER): Payer: Medicare Other | Admitting: Pediatrics

## 2019-05-18 ENCOUNTER — Encounter: Payer: Self-pay | Admitting: Pediatrics

## 2019-05-18 ENCOUNTER — Other Ambulatory Visit: Payer: Self-pay

## 2019-05-18 ENCOUNTER — Ambulatory Visit: Payer: Self-pay | Admitting: Pediatrics

## 2019-05-18 VITALS — BP 102/64 | HR 73 | Temp 97.1°F | Resp 18

## 2019-05-18 DIAGNOSIS — I10 Essential (primary) hypertension: Secondary | ICD-10-CM | POA: Diagnosis not present

## 2019-05-18 DIAGNOSIS — Z794 Long term (current) use of insulin: Secondary | ICD-10-CM

## 2019-05-18 DIAGNOSIS — H60393 Other infective otitis externa, bilateral: Secondary | ICD-10-CM

## 2019-05-18 DIAGNOSIS — Z79899 Other long term (current) drug therapy: Secondary | ICD-10-CM | POA: Diagnosis not present

## 2019-05-18 DIAGNOSIS — I950 Idiopathic hypotension: Secondary | ICD-10-CM

## 2019-05-18 DIAGNOSIS — E119 Type 2 diabetes mellitus without complications: Secondary | ICD-10-CM | POA: Diagnosis not present

## 2019-05-18 MED ORDER — NEOMYCIN-POLYMYXIN-HC 3.5-10000-1 OT SUSP: OTIC | 0 refills | Status: AC

## 2019-05-18 NOTE — Patient Instructions (Signed)
Cortisporin eardrops -4 drops 3 times a day but let it drip slowly from the opening of the ear canals Zyrtec 10 mg-take 1 tablet once a day if needed for itching If the itching does not improve over the next week, I suggest that you see Dr. Hassell Done for an ENT exam  Call your cardiologist regarding your blood pressure of 102/64 today.  Mention your fainting spell a few days ago He may need to adjust your blood pressure medication and/or your Lasix

## 2019-05-18 NOTE — Progress Notes (Signed)
[address] [place] [address] Dept: [phone]  FOLLOW UP NOTE  Patient ID: Robert Cain, male    DOB: 1930/02/13  Age: 85 y.o. MRN: [id number] Date of Office Visit: 05/18/2019  Assessment  Chief Complaint: Pruritis  HPI Robert Cain presents for follow-up of itchy ears.  He wears hearing aids.  He use Cortisporin eardrops 4 drops 3 times a day for a week and the symptoms inside the ear improved but he is having some itching in the outer ear where the ear buds fit.  He had hypotension a few days ago and went to the emergency room for treatment.  He is scheduled to have an EEG tomorrow.  His diabetes is well controlled.   Drug Allergies:  Allergies  Allergen Reactions  . Penicillins Hives and Rash    Other reaction(s): RASH   . Codeine Rash    Other reaction(s): RASH   . Morphine Rash    Other reaction(s): RASH     Physical Exam: BP 102/64   Pulse 73   Temp (!) 97.1 F (36.2 C) (Temporal)   Resp 18   SpO2 95%    Physical Exam Constitutional:      Appearance: Normal appearance. He is normal weight.  HENT:     Head:     Comments: Eyes normal except for a hemorrhage in the left conjunctiva most likely fromr a torn blood vessel blood vessel in the left conjunctiva..  Ears showed some inflammation at the opening of the ear canals where the ear buds from his hearing aids with fit.  Nose normal.  Pharynx normal. Neck:     Musculoskeletal: Neck supple.  Cardiovascular:     Rate and Rhythm: Normal rate and regular rhythm.     Comments: S1-S2 normal no murmurs Pulmonary:     Comments: Clear to percussion and auscultation Lymphadenopathy:     Cervical: No cervical adenopathy.  Neurological:     General: No focal deficit present.     Mental Status: He is alert and oriented to person, place, and time. Mental status is at baseline.  Psychiatric:        Mood and Affect: Mood normal.        Behavior: Behavior normal.        Thought Content: Thought content  normal.        Judgment: Judgment normal.     Diagnostics:    Assessment and Plan: 1. Other infective acute otitis externa of both ears   2. Insulin dependent type 2 diabetes mellitus ([place])   3. Essential hypertension   4. Current use of beta blocker   5. Idiopathic hypotension     Meds ordered this encounter  Medications  . neomycin-polymyxin-hydrocortisone (CORTISPORIN) 3.5-10000-1 OTIC suspension    Sig: Use 4 drops in each ear 3 times a day for a week.    Dispense:  10 mL    Refill:  0    Pt will call when needed    Patient Instructions  Cortisporin eardrops -4 drops 3 times a day but let it drip slowly from the opening of the ear canals Zyrtec 10 mg-take 1 tablet once a day if needed for itching If the itching does not improve over the next week, I suggest that you see Dr. Hassell Done for an ENT exam  Call your cardiologist regarding your blood pressure of 102/64 today.  Mention your fainting spell a few days ago He may need to adjust your blood pressure medication and/or your  Lasix   Return if symptoms worsen or fail to improve.    Thank you for the opportunity to care for this patient.  Please do not hesitate to contact me with questions.  Penne Lash, M.D.  [facility] [address] [place], [place] [address] [phone]

## 2019-05-20 ENCOUNTER — Ambulatory Visit: Payer: Self-pay | Admitting: Family Medicine

## 2019-06-01 ENCOUNTER — Other Ambulatory Visit: Payer: Self-pay | Admitting: Family Medicine

## 2019-06-30 ENCOUNTER — Other Ambulatory Visit: Payer: Self-pay | Admitting: Family Medicine

## 2019-07-01 ENCOUNTER — Other Ambulatory Visit: Payer: Self-pay | Admitting: Family Medicine

## 2019-08-10 ENCOUNTER — Other Ambulatory Visit: Payer: Self-pay

## 2019-08-10 ENCOUNTER — Ambulatory Visit (INDEPENDENT_AMBULATORY_CARE_PROVIDER_SITE_OTHER): Payer: Medicare Other | Admitting: Family Medicine

## 2019-08-10 ENCOUNTER — Encounter: Payer: Self-pay | Admitting: Family Medicine

## 2019-08-10 VITALS — BP 112/76 | HR 100 | Temp 98.1°F | Resp 20

## 2019-08-10 DIAGNOSIS — Z79899 Other long term (current) drug therapy: Secondary | ICD-10-CM

## 2019-08-10 DIAGNOSIS — J984 Other disorders of lung: Secondary | ICD-10-CM

## 2019-08-10 DIAGNOSIS — J3089 Other allergic rhinitis: Secondary | ICD-10-CM | POA: Diagnosis not present

## 2019-08-10 DIAGNOSIS — L500 Allergic urticaria: Secondary | ICD-10-CM

## 2019-08-10 DIAGNOSIS — Z9989 Dependence on other enabling machines and devices: Secondary | ICD-10-CM

## 2019-08-10 DIAGNOSIS — Z794 Long term (current) use of insulin: Secondary | ICD-10-CM | POA: Diagnosis not present

## 2019-08-10 DIAGNOSIS — I10 Essential (primary) hypertension: Secondary | ICD-10-CM

## 2019-08-10 DIAGNOSIS — E119 Type 2 diabetes mellitus without complications: Secondary | ICD-10-CM

## 2019-08-10 DIAGNOSIS — G4733 Obstructive sleep apnea (adult) (pediatric): Secondary | ICD-10-CM

## 2019-08-10 NOTE — Patient Instructions (Addendum)
Zyrtec 10 mg in the morning and hydroxyzine 25 mg at night for itching Use Dreft or Ivory detergent.  Do not use bleaches or softeners Daily shower for 10 minutes.  Pat dry and use Cetaphil lotion in the dry areas of skin Sarna lotion 3 times a day if needed for itching Add prednisone 10 mg twice a day for 4 days, 10 mg on the fifth day.  Did it help the itching while you were on it ?  Continue on your other medications Call us if you are not doing well on this treatment plan

## 2019-08-10 NOTE — Progress Notes (Signed)
[address] [place] [address] Dept: [phone]  FOLLOW UP NOTE  Patient ID: Robert Robert, male    DOB: 1929/11/26  Age: 86 y.o. MRN: [id number] Date of Office Visit: 08/10/2019  Assessment  Chief Complaint: Allergy Testing  HPI Robert Robert presents for evaluation of itching of his skin for about 5 months.  His itching is worse at night.  If he is busy doing things he does not notice the itching.  He has been on hydroxyzine 25 mg 2  times a day.  He has avoided chocolate and tomato without a great deal of relief.  Reportedly his kidney function is normal for his age.  He has not had thyroid disease.  His diabetes is well controlled.  He has been using Claritin 10 mg in the morning and hydroxyzine 25 mg twice a day with some relief of his symptoms.  He stopped them both 4 days before testing.  He is also on famotidine 20 mg twice a day Other current medications are outlined in the chart   Drug Allergies:  Allergies  Allergen Reactions  . Penicillins Hives and Rash    Other reaction(s): RASH   . Codeine Rash    Other reaction(s): RASH   . Morphine Rash    Other reaction(s): RASH     Physical Exam: BP 112/76   Pulse 100   Temp 98.1 F (36.7 C) (Oral)   Resp 20   SpO2 95%    Physical Exam Vitals reviewed.  Constitutional:      Appearance: Normal appearance. He is normal weight.  HENT:     Head:     Comments: Eyes normal.  Ears normal.  Nose normal.  Pharynx normal. Cardiovascular:     Rate and Rhythm: Normal rate and regular rhythm.     Comments: S1-S2 normal no murmurs Pulmonary:     Comments: Clear to percussion and auscultation Musculoskeletal:     Cervical back: Neck supple.  Lymphadenopathy:     Cervical: No cervical adenopathy.  Skin:    Comments: Dry otherwise clear  Neurological:     General: No focal deficit present.     Mental Status: He is alert and oriented to person, place, and time. Mental status is at baseline.  Psychiatric:      Mood and Affect: Mood normal.        Behavior: Behavior normal.        Thought Content: Thought content normal.        Judgment: Judgment normal.     Diagnostics: Allergy skin testing to foods was negative.  He had mild reactivity to some molds on intradermal testing only  Assessment and Plan: 1. Allergic urticaria   2. Other allergic rhinitis   3. Insulin dependent type 2 diabetes mellitus ([place])   4. Essential hypertension   5. Current use of beta blocker   6. Restrictive lung disease   7. Obstructive sleep apnea treated with continuous positive airway pressure (CPAP)     No orders of the defined types were placed in this encounter.   Patient Instructions  Zyrtec 10 mg in the morning and hydroxyzine 25 mg at night for itching Use Dreft or Ivory detergent.  Do not use bleaches or softeners Daily shower for 10 minutes.  Pat dry and use Cetaphil lotion in the dry areas of skin Sarna lotion 3 times a day if needed for itching Add prednisone 10 mg twice a day for 4 days, 10 mg on the fifth day.  Did it help the itching while you were on it ?  Continue on your other medications Call us if you are not doing well on this treatment plan   Return in about 2 weeks (around 08/24/2019).    Thank you for the opportunity to care for this patient.  Please do not hesitate to contact me with questions.  Penne Lash, M.D.  [facility] [address] [place], [place] [address] [phone]

## 2019-08-24 ENCOUNTER — Encounter: Payer: Self-pay | Admitting: Pediatrics

## 2019-08-24 ENCOUNTER — Ambulatory Visit (INDEPENDENT_AMBULATORY_CARE_PROVIDER_SITE_OTHER): Payer: Medicare Other | Admitting: Pediatrics

## 2019-08-24 ENCOUNTER — Other Ambulatory Visit: Payer: Self-pay

## 2019-08-24 VITALS — BP 106/64 | HR 81 | Temp 97.9°F | Resp 16

## 2019-08-24 DIAGNOSIS — Z794 Long term (current) use of insulin: Secondary | ICD-10-CM

## 2019-08-24 DIAGNOSIS — Z79899 Other long term (current) drug therapy: Secondary | ICD-10-CM

## 2019-08-24 DIAGNOSIS — E119 Type 2 diabetes mellitus without complications: Secondary | ICD-10-CM

## 2019-08-24 DIAGNOSIS — I10 Essential (primary) hypertension: Secondary | ICD-10-CM

## 2019-08-24 DIAGNOSIS — L500 Allergic urticaria: Secondary | ICD-10-CM | POA: Diagnosis not present

## 2019-08-24 NOTE — Progress Notes (Signed)
  [address] [place] [address] Dept: [phone]  FOLLOW UP NOTE  Patient ID: Robert Cain, male    DOB: 1929/10/28  Age: 86 y.o. MRN: [id number] Date of Office Visit: 08/24/2019  Assessment  Chief Complaint: Asthma  HPI Robert Cain presents for follow-up of allergic urticaria and dermatitis.  With the use of prednisone 10 mg twice a day for 4 days and 10 mg on the fifth day his symptoms cleared up.  He is doing a daily shower for 10 minutes followed by Cetaphil lotion on  the dry areas of skin.  He is on Zyrtec 10 mg in the morning and hydroxyzine 25 mg at night.  He is also using Sarna lotion 3 times a day if needed    Drug Allergies:  Allergies  Allergen Reactions  . Penicillins Hives and Rash    Other reaction(s): RASH   . Codeine Rash    Other reaction(s): RASH   . Morphine Rash    Other reaction(s): RASH     Physical Exam: BP 106/64   Pulse 81   Temp 97.9 F (36.6 C) (Oral)   Resp 16   SpO2 97%    Physical Exam Vitals reviewed.  Constitutional:      Appearance: Normal appearance. He is normal weight.  HENT:     Head:     Comments: Eyes normal.  Ears normal.  Nose normal.  Pharynx normal. Cardiovascular:     Comments: S1-S2 normal no murmurs Pulmonary:     Comments: Clear to percussion and auscultation Musculoskeletal:     Cervical back: Neck supple.  Lymphadenopathy:     Cervical: No cervical adenopathy.  Skin:    Comments: Clear  Neurological:     General: No focal deficit present.     Mental Status: He is alert and oriented to person, place, and time. Mental status is at baseline.  Psychiatric:        Mood and Affect: Mood normal.        Behavior: Behavior normal.        Thought Content: Thought content normal.        Judgment: Judgment normal.     Diagnostics:    Assessment and Plan: 1. Allergic urticaria   2. Insulin dependent type 2 diabetes mellitus ([place])   3. Essential hypertension   4. Current use of beta blocker     No orders of the defined types were placed in this encounter.   Patient Instructions  Zyrtec 10 mg in the morning and hydroxyzine 25  mg at night for itching  Daily shower for 10 minutes.  Pat dry and use Cetaphil lotion in the dry areas of skin  Sarna lotion 3 times a day if needed for itching  Continue on your other medications Call us if you are not doing well on this treatment plan   No follow-ups on file.    Thank you for the opportunity to care for this patient.  Please do not hesitate to contact me with questions.  Penne Lash, M.D.  [facility] [address] [place], [place] [address] [phone]

## 2019-08-24 NOTE — Patient Instructions (Signed)
Zyrtec 10 mg in the morning and hydroxyzine 25  mg at night for itching  Daily shower for 10 minutes.  Pat dry and use Cetaphil lotion in the dry areas of skin  Sarna lotion 3 times a day if needed for itching  Continue on your other medications Call us if you are not doing well on this treatment plan

## 2019-08-25 ENCOUNTER — Ambulatory Visit: Payer: Medicare Other | Admitting: Pediatrics

## 2019-10-30 ENCOUNTER — Other Ambulatory Visit: Payer: Self-pay | Admitting: Family Medicine

## 2020-01-21 ENCOUNTER — Other Ambulatory Visit: Payer: Self-pay | Admitting: Family Medicine

## 2020-02-03 ENCOUNTER — Other Ambulatory Visit: Payer: Self-pay | Admitting: Allergy and Immunology

## 2020-02-03 ENCOUNTER — Other Ambulatory Visit: Payer: Self-pay | Admitting: Pediatrics

## 2020-03-03 ENCOUNTER — Other Ambulatory Visit: Payer: Self-pay | Admitting: Allergy and Immunology

## 2020-04-24 ENCOUNTER — Other Ambulatory Visit: Payer: Self-pay | Admitting: Family Medicine

## 2020-04-25 ENCOUNTER — Other Ambulatory Visit: Payer: Self-pay | Admitting: Family Medicine

## 2020-05-23 ENCOUNTER — Other Ambulatory Visit: Payer: Self-pay | Admitting: Family Medicine

## 2020-06-19 ENCOUNTER — Other Ambulatory Visit: Payer: Self-pay | Admitting: Family Medicine

## 2020-09-14 ENCOUNTER — Other Ambulatory Visit: Payer: Self-pay | Admitting: Pediatrics

## 2020-09-14 NOTE — Telephone Encounter (Signed)
Spoke with wife, informed her that we couldn't give refill for famotidine because he is due for an appointment. She stated, she will let him know and he will call office to make an appointment.

## 2021-01-04 DEATH — deceased

## 2021-04-18 IMAGING — DX DG CHEST 1V PORT
1 series · 1 of 1 positions shown · non-contrast
Comparison: None.

CLINICAL DATA: Syncope

EXAM:
PORTABLE CHEST 1 VIEW

[chest ap]
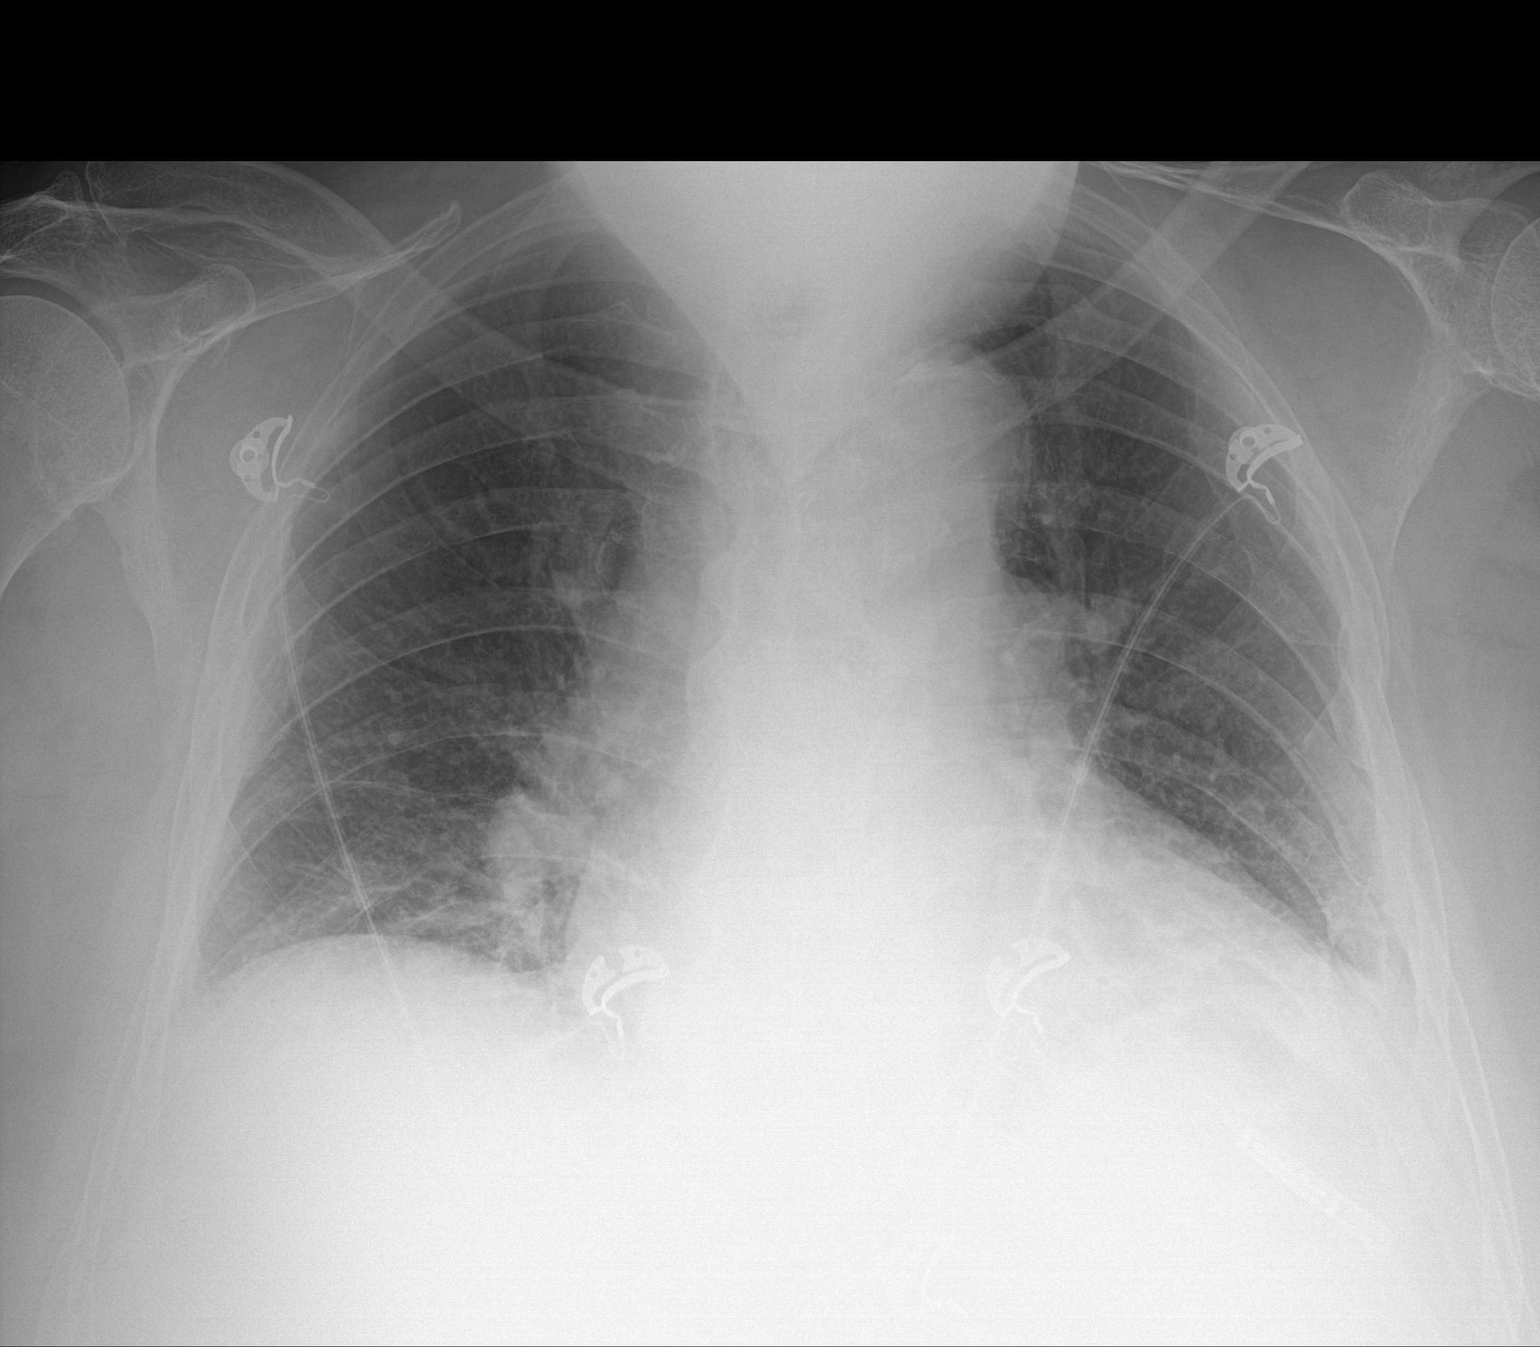

[1 of 1 positions shown; findings below may reference images not displayed]

FINDINGS: Aortic knob calcifications. There is borderline enlargement of the
cardiac silhouette. Both lungs are clear. The visualized skeletal
structures are unremarkable.
IMPRESSION: No acute cardiopulmonary process.
